# Patient Record
Sex: Female | Born: 1975
Health system: Southern US, Community
[De-identification: ages and names within clinical notes are randomized; demographics above are authoritative.]

## PROBLEM LIST (undated history)

## (undated) DIAGNOSIS — G43909 Migraine, unspecified, not intractable, without status migrainosus: Secondary | ICD-10-CM

## (undated) DIAGNOSIS — F4310 Post-traumatic stress disorder, unspecified: Secondary | ICD-10-CM

## (undated) DIAGNOSIS — G8929 Other chronic pain: Secondary | ICD-10-CM

## (undated) DIAGNOSIS — I10 Essential (primary) hypertension: Secondary | ICD-10-CM

## (undated) DIAGNOSIS — IMO0002 Reserved for concepts with insufficient information to code with codable children: Secondary | ICD-10-CM

## (undated) DIAGNOSIS — F603 Borderline personality disorder: Secondary | ICD-10-CM

## (undated) DIAGNOSIS — J45909 Unspecified asthma, uncomplicated: Secondary | ICD-10-CM

## (undated) DIAGNOSIS — M542 Cervicalgia: Secondary | ICD-10-CM

## (undated) DIAGNOSIS — M419 Scoliosis, unspecified: Secondary | ICD-10-CM

## (undated) DIAGNOSIS — M797 Fibromyalgia: Secondary | ICD-10-CM

## (undated) DIAGNOSIS — K589 Irritable bowel syndrome without diarrhea: Secondary | ICD-10-CM

## (undated) DIAGNOSIS — F419 Anxiety disorder, unspecified: Secondary | ICD-10-CM

## (undated) DIAGNOSIS — G4733 Obstructive sleep apnea (adult) (pediatric): Secondary | ICD-10-CM

## (undated) DIAGNOSIS — F319 Bipolar disorder, unspecified: Secondary | ICD-10-CM

## (undated) DIAGNOSIS — M5430 Sciatica, unspecified side: Secondary | ICD-10-CM

## (undated) DIAGNOSIS — M25569 Pain in unspecified knee: Secondary | ICD-10-CM

## (undated) DIAGNOSIS — M1990 Unspecified osteoarthritis, unspecified site: Secondary | ICD-10-CM

## (undated) DIAGNOSIS — F410 Panic disorder [episodic paroxysmal anxiety] without agoraphobia: Secondary | ICD-10-CM

## (undated) DIAGNOSIS — D649 Anemia, unspecified: Secondary | ICD-10-CM

## (undated) DIAGNOSIS — N83519 Torsion of ovary and ovarian pedicle, unspecified side: Secondary | ICD-10-CM

## (undated) DIAGNOSIS — M329 Systemic lupus erythematosus, unspecified: Secondary | ICD-10-CM

## (undated) DIAGNOSIS — M549 Dorsalgia, unspecified: Secondary | ICD-10-CM

## (undated) DIAGNOSIS — K449 Diaphragmatic hernia without obstruction or gangrene: Secondary | ICD-10-CM

## (undated) HISTORY — PX: KNEE SURGERY: SHX244

## (undated) HISTORY — PX: CHOLECYSTECTOMY: SHX55

## (undated) HISTORY — PX: HERNIA REPAIR: SHX51

## (undated) HISTORY — PX: BREAST SURGERY: SHX581

---

## 2007-11-10 ENCOUNTER — Emergency Department (HOSPITAL_BASED_OUTPATIENT_CLINIC_OR_DEPARTMENT_OTHER): Admission: EM | Admit: 2007-11-10 | Discharge: 2007-11-11 | Payer: Self-pay | Admitting: Emergency Medicine

## 2008-04-28 ENCOUNTER — Ambulatory Visit: Payer: Self-pay | Admitting: Interventional Radiology

## 2008-04-28 ENCOUNTER — Ambulatory Visit (HOSPITAL_BASED_OUTPATIENT_CLINIC_OR_DEPARTMENT_OTHER): Admission: RE | Admit: 2008-04-28 | Discharge: 2008-04-28 | Payer: Self-pay | Admitting: Internal Medicine

## 2008-05-09 ENCOUNTER — Emergency Department (HOSPITAL_BASED_OUTPATIENT_CLINIC_OR_DEPARTMENT_OTHER): Admission: EM | Admit: 2008-05-09 | Discharge: 2008-05-09 | Payer: Self-pay | Admitting: Emergency Medicine

## 2008-05-09 ENCOUNTER — Ambulatory Visit: Payer: Self-pay | Admitting: Diagnostic Radiology

## 2008-05-23 ENCOUNTER — Encounter: Payer: Self-pay | Admitting: Internal Medicine

## 2008-06-14 ENCOUNTER — Encounter: Payer: Self-pay | Admitting: Internal Medicine

## 2008-06-14 ENCOUNTER — Ambulatory Visit (HOSPITAL_BASED_OUTPATIENT_CLINIC_OR_DEPARTMENT_OTHER): Admission: RE | Admit: 2008-06-14 | Discharge: 2008-06-14 | Payer: Self-pay | Admitting: Internal Medicine

## 2008-06-14 ENCOUNTER — Ambulatory Visit: Payer: Self-pay | Admitting: Diagnostic Radiology

## 2008-10-18 ENCOUNTER — Encounter: Payer: Self-pay | Admitting: Internal Medicine

## 2008-10-26 ENCOUNTER — Encounter: Payer: Self-pay | Admitting: Internal Medicine

## 2008-11-02 ENCOUNTER — Encounter: Payer: Self-pay | Admitting: Internal Medicine

## 2008-11-02 ENCOUNTER — Ambulatory Visit (HOSPITAL_BASED_OUTPATIENT_CLINIC_OR_DEPARTMENT_OTHER): Admission: RE | Admit: 2008-11-02 | Discharge: 2008-11-02 | Payer: Self-pay | Admitting: Internal Medicine

## 2008-11-02 ENCOUNTER — Ambulatory Visit: Payer: Self-pay | Admitting: Radiology

## 2008-11-09 DIAGNOSIS — F319 Bipolar disorder, unspecified: Secondary | ICD-10-CM

## 2008-11-09 DIAGNOSIS — J45909 Unspecified asthma, uncomplicated: Secondary | ICD-10-CM | POA: Insufficient documentation

## 2008-11-09 DIAGNOSIS — K219 Gastro-esophageal reflux disease without esophagitis: Secondary | ICD-10-CM | POA: Insufficient documentation

## 2008-11-10 ENCOUNTER — Ambulatory Visit: Payer: Self-pay | Admitting: Internal Medicine

## 2008-11-10 DIAGNOSIS — R05 Cough: Secondary | ICD-10-CM

## 2008-11-10 DIAGNOSIS — N83209 Unspecified ovarian cyst, unspecified side: Secondary | ICD-10-CM

## 2009-07-19 ENCOUNTER — Ambulatory Visit (HOSPITAL_BASED_OUTPATIENT_CLINIC_OR_DEPARTMENT_OTHER): Admission: RE | Admit: 2009-07-19 | Discharge: 2009-07-19 | Payer: Self-pay | Admitting: Internal Medicine

## 2009-07-19 ENCOUNTER — Ambulatory Visit: Payer: Self-pay | Admitting: Diagnostic Radiology

## 2009-11-08 ENCOUNTER — Emergency Department (HOSPITAL_BASED_OUTPATIENT_CLINIC_OR_DEPARTMENT_OTHER): Admission: EM | Admit: 2009-11-08 | Discharge: 2009-11-08 | Payer: Self-pay | Admitting: Emergency Medicine

## 2009-11-08 ENCOUNTER — Ambulatory Visit: Payer: Self-pay | Admitting: Diagnostic Radiology

## 2010-05-11 ENCOUNTER — Emergency Department (INDEPENDENT_AMBULATORY_CARE_PROVIDER_SITE_OTHER): Payer: Medicaid Other

## 2010-05-11 ENCOUNTER — Emergency Department (HOSPITAL_BASED_OUTPATIENT_CLINIC_OR_DEPARTMENT_OTHER)
Admission: EM | Admit: 2010-05-11 | Discharge: 2010-05-11 | Disposition: A | Discharge status: Home / Self Care | Payer: Medicaid Other | Attending: Emergency Medicine | Admitting: Emergency Medicine

## 2010-05-11 DIAGNOSIS — J40 Bronchitis, not specified as acute or chronic: Secondary | ICD-10-CM | POA: Insufficient documentation

## 2010-05-11 DIAGNOSIS — J3489 Other specified disorders of nose and nasal sinuses: Secondary | ICD-10-CM | POA: Insufficient documentation

## 2010-05-11 DIAGNOSIS — F411 Generalized anxiety disorder: Secondary | ICD-10-CM | POA: Insufficient documentation

## 2010-05-11 DIAGNOSIS — R05 Cough: Secondary | ICD-10-CM

## 2010-05-11 DIAGNOSIS — R509 Fever, unspecified: Secondary | ICD-10-CM | POA: Insufficient documentation

## 2010-05-11 DIAGNOSIS — K219 Gastro-esophageal reflux disease without esophagitis: Secondary | ICD-10-CM | POA: Insufficient documentation

## 2010-05-11 DIAGNOSIS — R0602 Shortness of breath: Secondary | ICD-10-CM | POA: Insufficient documentation

## 2010-05-11 DIAGNOSIS — F319 Bipolar disorder, unspecified: Secondary | ICD-10-CM | POA: Insufficient documentation

## 2010-05-11 DIAGNOSIS — R059 Cough, unspecified: Secondary | ICD-10-CM | POA: Insufficient documentation

## 2010-05-11 DIAGNOSIS — R079 Chest pain, unspecified: Secondary | ICD-10-CM | POA: Insufficient documentation

## 2010-05-11 DIAGNOSIS — Z79899 Other long term (current) drug therapy: Secondary | ICD-10-CM | POA: Insufficient documentation

## 2010-05-11 DIAGNOSIS — J45909 Unspecified asthma, uncomplicated: Secondary | ICD-10-CM | POA: Insufficient documentation

## 2010-05-11 DIAGNOSIS — M329 Systemic lupus erythematosus, unspecified: Secondary | ICD-10-CM | POA: Insufficient documentation

## 2010-06-01 LAB — CBC
HCT: 40.3 % (ref 36.0–46.0)
Hemoglobin: 13.5 g/dL (ref 12.0–15.0)
MCH: 27.4 pg (ref 26.0–34.0)
MCV: 81.7 fL (ref 78.0–100.0)
Platelets: 322 10*3/uL (ref 150–400)

## 2010-06-01 LAB — D-DIMER, QUANTITATIVE: D-Dimer, Quant: 0.94 ug/mL-FEU — ABNORMAL HIGH (ref 0.00–0.48)

## 2010-06-01 LAB — DIFFERENTIAL
Basophils Absolute: 0.1 10*3/uL (ref 0.0–0.1)
Eosinophils Absolute: 0 10*3/uL (ref 0.0–0.7)
Eosinophils Relative: 0 % (ref 0–5)
Lymphs Abs: 0.9 10*3/uL (ref 0.7–4.0)
Monocytes Absolute: 0.2 10*3/uL (ref 0.1–1.0)
Monocytes Relative: 2 % — ABNORMAL LOW (ref 3–12)
Neutro Abs: 11.6 10*3/uL — ABNORMAL HIGH (ref 1.7–7.7)
Neutrophils Relative %: 91 % — ABNORMAL HIGH (ref 43–77)

## 2010-06-01 LAB — URINALYSIS, ROUTINE W REFLEX MICROSCOPIC
Bilirubin Urine: NEGATIVE
Protein, ur: NEGATIVE mg/dL
Specific Gravity, Urine: 1.01 (ref 1.005–1.030)
Urobilinogen, UA: 0.2 mg/dL (ref 0.0–1.0)
pH: 6.5 (ref 5.0–8.0)

## 2010-06-01 LAB — BASIC METABOLIC PANEL
BUN: 13 mg/dL (ref 6–23)
CO2: 26 mEq/L (ref 19–32)
GFR calc Af Amer: >60 mL/min (ref >60)
Sodium: 141 mEq/L (ref 135–145)

## 2010-06-01 LAB — URINE MICROSCOPIC-ADD ON

## 2010-06-01 LAB — PREGNANCY, URINE: Preg Test, Ur: NEGATIVE

## 2010-07-03 LAB — PREGNANCY, URINE: Preg Test, Ur: NEGATIVE

## 2010-07-03 LAB — URINALYSIS, ROUTINE W REFLEX MICROSCOPIC
Hgb urine dipstick: NEGATIVE
Ketones, ur: NEGATIVE mg/dL
Nitrite: NEGATIVE

## 2010-07-03 LAB — URINE MICROSCOPIC-ADD ON

## 2010-08-29 ENCOUNTER — Emergency Department (HOSPITAL_BASED_OUTPATIENT_CLINIC_OR_DEPARTMENT_OTHER)
Admission: EM | Admit: 2010-08-29 | Discharge: 2010-08-29 | Disposition: A | Discharge status: Home / Self Care | Payer: Medicaid Other | Attending: Emergency Medicine | Admitting: Emergency Medicine

## 2010-08-29 DIAGNOSIS — IMO0001 Reserved for inherently not codable concepts without codable children: Secondary | ICD-10-CM | POA: Insufficient documentation

## 2010-08-29 DIAGNOSIS — R112 Nausea with vomiting, unspecified: Secondary | ICD-10-CM | POA: Insufficient documentation

## 2010-08-29 DIAGNOSIS — F319 Bipolar disorder, unspecified: Secondary | ICD-10-CM | POA: Insufficient documentation

## 2010-08-29 DIAGNOSIS — J45909 Unspecified asthma, uncomplicated: Secondary | ICD-10-CM | POA: Insufficient documentation

## 2010-08-29 DIAGNOSIS — R51 Headache: Secondary | ICD-10-CM | POA: Insufficient documentation

## 2010-08-29 DIAGNOSIS — R42 Dizziness and giddiness: Secondary | ICD-10-CM | POA: Insufficient documentation

## 2010-08-29 LAB — CBC
HCT: 37.6 % (ref 36.0–46.0)
Hemoglobin: 12.7 g/dL (ref 12.0–15.0)
MCH: 26.6 pg (ref 26.0–34.0)
MCHC: 33.8 g/dL (ref 30.0–36.0)
RBC: 4.78 MIL/uL (ref 3.87–5.11)
WBC: 10.6 10*3/uL — ABNORMAL HIGH (ref 4.0–10.5)

## 2010-08-29 LAB — URINALYSIS, ROUTINE W REFLEX MICROSCOPIC
Hgb urine dipstick: NEGATIVE
Ketones, ur: NEGATIVE mg/dL
Protein, ur: NEGATIVE mg/dL
Specific Gravity, Urine: 1.015 (ref 1.005–1.030)
Urobilinogen, UA: 0.2 mg/dL (ref 0.0–1.0)

## 2010-08-29 LAB — COMPREHENSIVE METABOLIC PANEL
ALT: 20 U/L (ref 0–35)
Alkaline Phosphatase: 64 U/L (ref 39–117)
CO2: 24 mEq/L (ref 19–32)
Chloride: 101 mEq/L (ref 96–112)
Creatinine, Ser: 0.7 mg/dL (ref 0.4–1.2)
GFR calc Af Amer: >60 mL/min (ref >60)
GFR calc non Af Amer: >60 mL/min (ref >60)
Glucose, Bld: 90 mg/dL (ref 70–99)
Potassium: 3.6 mEq/L (ref 3.5–5.1)
Sodium: 138 mEq/L (ref 135–145)
Total Bilirubin: 0.3 mg/dL (ref 0.3–1.2)

## 2010-08-29 LAB — DIFFERENTIAL
Basophils Relative: 0 % (ref 0–1)
Eosinophils Absolute: 0.1 10*3/uL (ref 0.0–0.7)
Monocytes Absolute: 0.7 10*3/uL (ref 0.1–1.0)
Monocytes Relative: 6 % (ref 3–12)
Neutro Abs: 7.1 10*3/uL (ref 1.7–7.7)

## 2010-08-29 LAB — URINE MICROSCOPIC-ADD ON

## 2011-04-22 ENCOUNTER — Encounter (HOSPITAL_BASED_OUTPATIENT_CLINIC_OR_DEPARTMENT_OTHER): Payer: Self-pay | Admitting: *Deleted

## 2011-04-22 ENCOUNTER — Emergency Department (HOSPITAL_BASED_OUTPATIENT_CLINIC_OR_DEPARTMENT_OTHER)
Admission: EM | Admit: 2011-04-22 | Discharge: 2011-04-22 | Disposition: A | Discharge status: Home / Self Care | Payer: Medicaid Other | Attending: Emergency Medicine | Admitting: Emergency Medicine

## 2011-04-22 ENCOUNTER — Emergency Department (INDEPENDENT_AMBULATORY_CARE_PROVIDER_SITE_OTHER): Payer: Medicaid Other

## 2011-04-22 ENCOUNTER — Other Ambulatory Visit: Payer: Self-pay

## 2011-04-22 DIAGNOSIS — R1013 Epigastric pain: Secondary | ICD-10-CM

## 2011-04-22 DIAGNOSIS — K921 Melena: Secondary | ICD-10-CM

## 2011-04-22 DIAGNOSIS — R109 Unspecified abdominal pain: Secondary | ICD-10-CM | POA: Insufficient documentation

## 2011-04-22 DIAGNOSIS — F319 Bipolar disorder, unspecified: Secondary | ICD-10-CM | POA: Insufficient documentation

## 2011-04-22 DIAGNOSIS — R197 Diarrhea, unspecified: Secondary | ICD-10-CM | POA: Insufficient documentation

## 2011-04-22 DIAGNOSIS — R110 Nausea: Secondary | ICD-10-CM

## 2011-04-22 HISTORY — DX: Panic disorder (episodic paroxysmal anxiety): F41.0

## 2011-04-22 HISTORY — DX: Diaphragmatic hernia without obstruction or gangrene: K44.9

## 2011-04-22 HISTORY — DX: Bipolar disorder, unspecified: F31.9

## 2011-04-22 HISTORY — DX: Anxiety disorder, unspecified: F41.9

## 2011-04-22 LAB — DIFFERENTIAL
Eosinophils Absolute: 0 10*3/uL (ref 0.0–0.7)
Eosinophils Relative: 0 % (ref 0–5)
Lymphs Abs: 2.4 10*3/uL (ref 0.7–4.0)
Monocytes Relative: 6 % (ref 3–12)
Neutrophils Relative %: 69 % (ref 43–77)

## 2011-04-22 LAB — URINALYSIS, ROUTINE W REFLEX MICROSCOPIC
Bilirubin Urine: NEGATIVE
Hgb urine dipstick: NEGATIVE
Ketones, ur: NEGATIVE mg/dL
Protein, ur: NEGATIVE mg/dL
Urobilinogen, UA: 0.2 mg/dL (ref 0.0–1.0)

## 2011-04-22 LAB — CBC
HCT: 40.6 % (ref 36.0–46.0)
Hemoglobin: 13.6 g/dL (ref 12.0–15.0)
MCH: 26.6 pg (ref 26.0–34.0)
MCV: 79.5 fL (ref 78.0–100.0)
RBC: 5.11 MIL/uL (ref 3.87–5.11)

## 2011-04-22 LAB — COMPREHENSIVE METABOLIC PANEL
Alkaline Phosphatase: 64 U/L (ref 39–117)
BUN: 10 mg/dL (ref 6–23)
GFR calc Af Amer: >90 mL/min (ref >90)
Glucose, Bld: 90 mg/dL (ref 70–99)
Potassium: 3.6 mEq/L (ref 3.5–5.1)
Total Protein: 7.8 g/dL (ref 6.0–8.3)

## 2011-04-22 LAB — LIPASE, BLOOD: Lipase: 28 U/L (ref 11–59)

## 2011-04-22 LAB — URINE MICROSCOPIC-ADD ON

## 2011-04-22 LAB — OCCULT BLOOD X 1 CARD TO LAB, STOOL: Fecal Occult Bld: NEGATIVE

## 2011-04-22 MED ORDER — IOHEXOL 300 MG/ML  SOLN
20.0000 mL | INTRAMUSCULAR | Status: DC | PRN
  Administered 2011-04-22: 20 mL via ORAL

## 2011-04-22 MED ORDER — HYDROCODONE-ACETAMINOPHEN 5-325 MG PO TABS: 2.0000 | ORAL_TABLET | ORAL | Status: AC | PRN

## 2011-04-22 MED ORDER — SODIUM CHLORIDE 0.9 % IV SOLN
999.0000 mL | Freq: Once | INTRAVENOUS | Status: AC
  Administered 2011-04-22: 1000 mL via INTRAVENOUS

## 2011-04-22 MED ORDER — IOHEXOL 300 MG/ML  SOLN
100.0000 mL | Freq: Once | INTRAMUSCULAR | Status: AC | PRN
  Administered 2011-04-22: 100 mL via INTRAVENOUS

## 2011-04-22 MED ORDER — HYDROMORPHONE HCL PF 1 MG/ML IJ SOLN
1.0000 mg | Freq: Once | INTRAMUSCULAR | Status: AC
  Administered 2011-04-22: 1 mg via INTRAVENOUS
  Filled 2011-04-22: qty 1

## 2011-04-22 MED ORDER — OMEPRAZOLE 20 MG PO CPDR: 20.0000 mg | DELAYED_RELEASE_CAPSULE | Freq: Every day | ORAL | Status: DC

## 2011-04-22 MED ORDER — ONDANSETRON HCL 4 MG/2ML IJ SOLN
4.0000 mg | Freq: Once | INTRAMUSCULAR | Status: AC
  Administered 2011-04-22: 4 mg via INTRAVENOUS
  Filled 2011-04-22: qty 2

## 2011-04-22 NOTE — ED Provider Notes (Signed)
Medical screening examination/treatment/procedure(s) were performed by non-physician practitioner and as supervising physician I was immediately available for consultation/collaboration. Janeene Sand Y.   Gavin Pound. Ranessa Kosta, MD 04/22/11 2356

## 2011-04-22 NOTE — ED Notes (Signed)
Epigastric pain for a few days. Right arm numbness. Has been off medications x 2 weeks. Rx for Xanax ran out and she cannot get anyone to refill it. Blood in her stools today x 4.

## 2011-04-22 NOTE — ED Provider Notes (Signed)
History     CSN: [id number]  Arrival date & time 04/22/11  1500   First MD Initiated Contact with Patient 04/22/11 1659      Chief Complaint  Patient presents with  . Abdominal Pain    (Consider location/radiation/quality/duration/timing/severity/associated sxs/prior treatment) Patient is a 36 y.o. female presenting with abdominal pain. The history is provided by the patient. No language interpreter was used.  Abdominal Pain The primary symptoms of the illness include abdominal pain. The current episode started more than 2 days ago. The onset of the illness was gradual. The problem has been gradually worsening.  Associated with: hx of hiatal hernia. The patient states that she believes she is currently not pregnant. Risk factors for an acute abdominal problem include a history of abdominal surgery. Additional symptoms associated with the illness include anorexia. Symptoms associated with the illness do not include chills, constipation, frequency or back pain. Significant associated medical issues include GERD and gallstones. Significant associated medical issues do not include diverticulitis.  Pt has had gallbladder removed.  Pt had a colonoscopy and had a polyp removed.  Pt reports she has been told that she has a hiatal hernia.   Pt reports she has had diarrhea x 4 today with blood in stool  Past Medical History  Diagnosis Date  . Hiatal hernia   . Anxiety   . Bipolar 1 disorder   . Panic attack     Past Surgical History  Procedure Date  . Cholecystectomy   . Breast surgery     No family history on file.  History  Substance Use Topics  . Smoking status: Former Games developer  . Smokeless tobacco: Not on file  . Alcohol Use: No    OB History    Grav Para Term Preterm Abortions TAB SAB Ect Mult Living                  Review of Systems  Constitutional: Negative for chills.  Gastrointestinal: Positive for abdominal pain, blood in stool and anorexia. Negative for  constipation.  Genitourinary: Negative for frequency.  Musculoskeletal: Negative for back pain.  All other systems reviewed and are negative.    Allergies  Clindamycin/lincomycin and Latex  Home Medications   Current Outpatient Rx  Name Route Sig Dispense Refill  . NAPROXEN SODIUM 220 MG PO TABS Oral Take 220 mg by mouth 2 (two) times daily with a meal.      BP 123/88  Pulse 72  Temp(Src) 99.3 F (37.4 C) (Oral)  Resp 20  SpO2 100%  LMP 04/16/2011  Physical Exam  Nursing note and vitals reviewed. Constitutional: She appears well-developed and well-nourished.  HENT:  Head: Normocephalic and atraumatic.  Right Ear: External ear normal.  Left Ear: External ear normal.  Nose: Nose normal.  Mouth/Throat: Oropharynx is clear and moist.  Eyes: Conjunctivae and EOM are normal. Pupils are equal, round, and reactive to light.  Neck: Normal range of motion. Neck supple.  Cardiovascular: Normal rate.   Pulmonary/Chest: Effort normal.  Abdominal: Soft.  Genitourinary: Guaiac negative stool.  Musculoskeletal: Normal range of motion.  Neurological: She is alert.  Skin: Skin is warm.    ED Course  Procedures (including critical care time)  Labs Reviewed  URINALYSIS, ROUTINE W REFLEX MICROSCOPIC - Abnormal; Notable for the following:    Leukocytes, UA TRACE (*)    All other components within normal limits  URINE MICROSCOPIC-ADD ON - Abnormal; Notable for the following:    Bacteria, UA FEW (*)  All other components within normal limits  CBC  DIFFERENTIAL  COMPREHENSIVE METABOLIC PANEL  LIPASE, BLOOD  PREGNANCY, URINE  OCCULT BLOOD X 1 CARD TO LAB, STOOL   Ct Abdomen Pelvis W Contrast  04/22/2011  *RADIOLOGY REPORT*  Clinical Data: Epigastric pain with nausea and bloody stools for 4 days.  CT ABDOMEN AND PELVIS WITH CONTRAST  Technique:  Multidetector CT imaging of the abdomen and pelvis was performed following the standard protocol during bolus administration of  intravenous contrast.  Contrast: OMNIPAQUE IOHEXOL 300 MG/ML IV SOLN  Comparison: Chest CT 11/08/2009.  Findings: The lung bases are clear.  There is no pleural effusion. The gallbladder is surgically absent.  There is no significant biliary dilatation.  The liver, spleen, pancreas, adrenal glands and kidneys appear normal.  The bowel gas pattern is normal.  The appendix is not visualized. No inflammatory changes are identified.  There are no enlarged lymph nodes.  The uterus and ovaries appear normal.  The bladder appears normal. There are no acute or significant osseous findings.  There is a probable incidental bone island within the left aspect of the L5 vertebral body.  IMPRESSION: No acute abdominal pelvic findings.  Query prior appendectomy.  Original Report Authenticated By: Gerrianne Scale, M.D.     No diagnosis found.   Date: 04/22/2011  Rate: 68  Rhythm: normal sinus rhythm  QRS Axis: normal  Intervals: normal  ST/T Wave abnormalities: normal  Conduction Disutrbances:none  Narrative Interpretation:   Old EKG Reviewed: unchanged   MDM   I advised pt to take prilosec.  I advised recheck with her Gi doctor.  Pt given hydrocodone 10 tablets for pain.         Langston Masker, [place] 04/22/11 2155

## 2011-11-26 ENCOUNTER — Other Ambulatory Visit (HOSPITAL_COMMUNITY)
Admission: RE | Admit: 2011-11-26 | Discharge: 2011-11-26 | Disposition: A | Discharge status: Home / Self Care | Payer: Medicaid Other | Source: Ambulatory Visit | Attending: Obstetrics & Gynecology | Admitting: Obstetrics & Gynecology

## 2011-11-26 DIAGNOSIS — Z113 Encounter for screening for infections with a predominantly sexual mode of transmission: Secondary | ICD-10-CM | POA: Insufficient documentation

## 2011-11-26 DIAGNOSIS — Z124 Encounter for screening for malignant neoplasm of cervix: Secondary | ICD-10-CM | POA: Insufficient documentation

## 2011-11-26 DIAGNOSIS — Z1151 Encounter for screening for human papillomavirus (HPV): Secondary | ICD-10-CM | POA: Insufficient documentation

## 2012-03-21 ENCOUNTER — Emergency Department (HOSPITAL_BASED_OUTPATIENT_CLINIC_OR_DEPARTMENT_OTHER)
Admission: EM | Admit: 2012-03-21 | Discharge: 2012-03-21 | Disposition: A | Discharge status: Home / Self Care | Payer: Medicaid Other | Attending: Emergency Medicine | Admitting: Emergency Medicine

## 2012-03-21 ENCOUNTER — Encounter (HOSPITAL_BASED_OUTPATIENT_CLINIC_OR_DEPARTMENT_OTHER): Payer: Self-pay | Admitting: *Deleted

## 2012-03-21 ENCOUNTER — Emergency Department (HOSPITAL_BASED_OUTPATIENT_CLINIC_OR_DEPARTMENT_OTHER): Payer: Medicaid Other

## 2012-03-21 DIAGNOSIS — R509 Fever, unspecified: Secondary | ICD-10-CM | POA: Insufficient documentation

## 2012-03-21 DIAGNOSIS — R0789 Other chest pain: Secondary | ICD-10-CM | POA: Insufficient documentation

## 2012-03-21 DIAGNOSIS — J069 Acute upper respiratory infection, unspecified: Secondary | ICD-10-CM | POA: Insufficient documentation

## 2012-03-21 DIAGNOSIS — R5381 Other malaise: Secondary | ICD-10-CM | POA: Insufficient documentation

## 2012-03-21 DIAGNOSIS — Z8659 Personal history of other mental and behavioral disorders: Secondary | ICD-10-CM | POA: Insufficient documentation

## 2012-03-21 DIAGNOSIS — R110 Nausea: Secondary | ICD-10-CM | POA: Insufficient documentation

## 2012-03-21 DIAGNOSIS — Z79899 Other long term (current) drug therapy: Secondary | ICD-10-CM | POA: Insufficient documentation

## 2012-03-21 DIAGNOSIS — Z8719 Personal history of other diseases of the digestive system: Secondary | ICD-10-CM | POA: Insufficient documentation

## 2012-03-21 DIAGNOSIS — Z87891 Personal history of nicotine dependence: Secondary | ICD-10-CM | POA: Insufficient documentation

## 2012-03-21 DIAGNOSIS — F319 Bipolar disorder, unspecified: Secondary | ICD-10-CM | POA: Insufficient documentation

## 2012-03-21 DIAGNOSIS — IMO0001 Reserved for inherently not codable concepts without codable children: Secondary | ICD-10-CM | POA: Insufficient documentation

## 2012-03-21 DIAGNOSIS — R6889 Other general symptoms and signs: Secondary | ICD-10-CM

## 2012-03-21 DIAGNOSIS — R5383 Other fatigue: Secondary | ICD-10-CM | POA: Insufficient documentation

## 2012-03-21 DIAGNOSIS — M2550 Pain in unspecified joint: Secondary | ICD-10-CM | POA: Insufficient documentation

## 2012-03-21 DIAGNOSIS — R52 Pain, unspecified: Secondary | ICD-10-CM | POA: Insufficient documentation

## 2012-03-21 DIAGNOSIS — J029 Acute pharyngitis, unspecified: Secondary | ICD-10-CM | POA: Insufficient documentation

## 2012-03-21 DIAGNOSIS — R51 Headache: Secondary | ICD-10-CM | POA: Insufficient documentation

## 2012-03-21 MED ORDER — ACETAMINOPHEN 325 MG PO TABS: 650.0000 mg | ORAL_TABLET | Freq: Four times a day (QID) | ORAL | Status: DC | PRN

## 2012-03-21 MED ORDER — ACETAMINOPHEN 325 MG PO TABS
650.0000 mg | ORAL_TABLET | Freq: Once | ORAL | Status: AC
  Administered 2012-03-21: 650 mg via ORAL
  Filled 2012-03-21: qty 2

## 2012-03-21 MED ORDER — OSELTAMIVIR PHOSPHATE 75 MG PO CAPS
75.0000 mg | ORAL_CAPSULE | Freq: Two times a day (BID) | ORAL | Status: DC
Start: 2012-03-21 — End: 2013-04-03

## 2012-03-21 NOTE — ED Notes (Signed)
Pt d/c home with spouse- rx x 2 given for tamiflu and tylenol

## 2012-03-21 NOTE — ED Notes (Signed)
Pt c/o URI symptoms  x 1 day  

## 2012-03-21 NOTE — ED Provider Notes (Signed)
History     CSN: [id number]  Arrival date & time 03/21/12  1522   First MD Initiated Contact with Patient 03/21/12 1544      Chief Complaint  Patient presents with  . URI    (Consider location/radiation/quality/duration/timing/severity/associated sxs/prior treatment) HPI Comments: 37 year old female presents the emergency department complaining of fever and cough beginning last night. Cough is productive with green mucus. She took ibuprofen prior to arrival since she felt she had a fever. Admits to associated headache, body aches and nausea. Her husband is sick with similar symptoms. She did not have her flu shot this year.  The history is provided by the patient.    Past Medical History  Diagnosis Date  . Hiatal hernia   . Anxiety   . Bipolar 1 disorder   . Panic attack     Past Surgical History  Procedure Date  . Cholecystectomy   . Breast surgery     History reviewed. No pertinent family history.  History  Substance Use Topics  . Smoking status: Former Games developer  . Smokeless tobacco: Not on file  . Alcohol Use: No    OB History    Grav Para Term Preterm Abortions TAB SAB Ect Mult Living                  Review of Systems  Constitutional: Positive for fever, chills and fatigue.  HENT: Positive for sore throat. Negative for neck pain and neck stiffness.   Respiratory: Positive for cough and chest tightness. Negative for wheezing.   Cardiovascular: Negative for chest pain.  Gastrointestinal: Positive for nausea. Negative for vomiting.  Genitourinary: Negative.   Musculoskeletal: Positive for myalgias and arthralgias.  Skin: Negative.   Neurological: Positive for headaches.  Psychiatric/Behavioral: Negative for confusion.    Allergies  Clindamycin/lincomycin and Latex  Home Medications   Current Outpatient Rx  Name  Route  Sig  Dispense  Refill  . NAPROXEN SODIUM 220 MG PO TABS   Oral   Take 220 mg by mouth 2 (two) times daily with a meal.           . OMEPRAZOLE 20 MG PO CPDR   Oral   Take 1 capsule (20 mg total) by mouth daily.   30 capsule   0     BP 121/81  Pulse 120  Temp 102.7 F (39.3 C) (Oral)  Resp 16  Ht 5\' 9"  (1.753 m)  Wt 200 lb (90.719 kg)  BMI 29.53 kg/m2  SpO2 100%  LMP 03/07/2012  Physical Exam  Constitutional: She is oriented to person, place, and time. She appears well-developed and well-nourished. No distress. Face mask in place.       Coughing, appears sick.  HENT:  Head: Normocephalic and atraumatic.  Nose: Mucosal edema present.  Mouth/Throat: Posterior oropharyngeal erythema present. No oropharyngeal exudate or posterior oropharyngeal edema.  Eyes: Conjunctivae normal are normal.  Neck: Normal range of motion. Neck supple.  Cardiovascular: Regular rhythm and normal heart sounds.  Tachycardia present.   Pulmonary/Chest: Effort normal. She has rhonchi (scattered expiratory, cleared with coughing).  Abdominal: Soft. Normal appearance and bowel sounds are normal. There is generalized tenderness (mild).  Musculoskeletal: Normal range of motion. She exhibits no edema.  Lymphadenopathy:    She has cervical adenopathy.  Neurological: She is alert and oriented to person, place, and time.  Skin: Skin is warm.  Psychiatric: She has a normal mood and affect. Her behavior is normal.    ED Course  Procedures (  including critical care time)  Labs Reviewed - No data to display Dg Chest 2 View  03/21/2012  *RADIOLOGY REPORT*  Clinical Data: Fever, cough, shortness of breath  CHEST - 2 VIEW  Comparison: 05/11/2010  Findings: Cardiomediastinal silhouette is within normal limits. The lungs are clear. No pleural effusion.  No pneumothorax.  No acute osseous abnormality.  A clip projects over the soft tissues of the left breast. Cholecystectomy clips noted.  IMPRESSION: No acute cardiopulmonary process.   Original Report Authenticated By: Christiana Pellant, M.D.      1. Flu-like symptoms   2. URI (upper respiratory  infection)       MDM  37 y/o female with flu-like symptoms. Ronchi on lung auscultation cleared with cough. CXR obtained to r/o pneumonia. CXR normal. Tylenol given for fever. Rx tamiflu. Infection care and precautions discussed. Return precautions discussed. Patient states understanding of plan and is agreeable.         Trevor Mace, PA-C 03/21/12 1651

## 2012-03-21 NOTE — ED Provider Notes (Signed)
Medical screening examination/treatment/procedure(s) were performed by non-physician practitioner and as supervising physician I was immediately available for consultation/collaboration.   Dione Booze, MD 03/21/12 [phone]

## 2012-07-10 ENCOUNTER — Emergency Department (HOSPITAL_BASED_OUTPATIENT_CLINIC_OR_DEPARTMENT_OTHER)
Admission: EM | Admit: 2012-07-10 | Discharge: 2012-07-10 | Disposition: A | Discharge status: Home / Self Care | Payer: Medicaid Other | Attending: Emergency Medicine | Admitting: Emergency Medicine

## 2012-07-10 ENCOUNTER — Encounter (HOSPITAL_BASED_OUTPATIENT_CLINIC_OR_DEPARTMENT_OTHER): Payer: Self-pay | Admitting: *Deleted

## 2012-07-10 ENCOUNTER — Emergency Department (HOSPITAL_BASED_OUTPATIENT_CLINIC_OR_DEPARTMENT_OTHER): Payer: Medicaid Other

## 2012-07-10 DIAGNOSIS — Z87440 Personal history of urinary (tract) infections: Secondary | ICD-10-CM | POA: Insufficient documentation

## 2012-07-10 DIAGNOSIS — R238 Other skin changes: Secondary | ICD-10-CM | POA: Insufficient documentation

## 2012-07-10 DIAGNOSIS — Z9089 Acquired absence of other organs: Secondary | ICD-10-CM | POA: Insufficient documentation

## 2012-07-10 DIAGNOSIS — Z87891 Personal history of nicotine dependence: Secondary | ICD-10-CM | POA: Insufficient documentation

## 2012-07-10 DIAGNOSIS — M549 Dorsalgia, unspecified: Secondary | ICD-10-CM

## 2012-07-10 DIAGNOSIS — Z8719 Personal history of other diseases of the digestive system: Secondary | ICD-10-CM | POA: Insufficient documentation

## 2012-07-10 DIAGNOSIS — F319 Bipolar disorder, unspecified: Secondary | ICD-10-CM | POA: Insufficient documentation

## 2012-07-10 DIAGNOSIS — I10 Essential (primary) hypertension: Secondary | ICD-10-CM | POA: Insufficient documentation

## 2012-07-10 DIAGNOSIS — M546 Pain in thoracic spine: Secondary | ICD-10-CM | POA: Insufficient documentation

## 2012-07-10 DIAGNOSIS — Z9889 Other specified postprocedural states: Secondary | ICD-10-CM | POA: Insufficient documentation

## 2012-07-10 DIAGNOSIS — Z91040 Latex allergy status: Secondary | ICD-10-CM | POA: Insufficient documentation

## 2012-07-10 DIAGNOSIS — Z3202 Encounter for pregnancy test, result negative: Secondary | ICD-10-CM | POA: Insufficient documentation

## 2012-07-10 DIAGNOSIS — F410 Panic disorder [episodic paroxysmal anxiety] without agoraphobia: Secondary | ICD-10-CM | POA: Insufficient documentation

## 2012-07-10 DIAGNOSIS — Z79899 Other long term (current) drug therapy: Secondary | ICD-10-CM | POA: Insufficient documentation

## 2012-07-10 HISTORY — DX: Essential (primary) hypertension: I10

## 2012-07-10 LAB — CBC WITH DIFFERENTIAL/PLATELET
Eosinophils Absolute: 0.2 10*3/uL (ref 0.0–0.7)
Eosinophils Relative: 2 % (ref 0–5)
Hemoglobin: 13.3 g/dL (ref 12.0–15.0)
Lymphocytes Relative: 25 % (ref 12–46)
Lymphs Abs: 2.6 10*3/uL (ref 0.7–4.0)
MCH: 26.9 pg (ref 26.0–34.0)
MCV: 80 fL (ref 78.0–100.0)
Monocytes Relative: 7 % (ref 3–12)
RBC: 4.94 MIL/uL (ref 3.87–5.11)

## 2012-07-10 LAB — URINALYSIS, ROUTINE W REFLEX MICROSCOPIC
Nitrite: NEGATIVE
Specific Gravity, Urine: 1.018 (ref 1.005–1.030)
pH: 6 (ref 5.0–8.0)

## 2012-07-10 LAB — URINE MICROSCOPIC-ADD ON

## 2012-07-10 LAB — BASIC METABOLIC PANEL
BUN: 11 mg/dL (ref 6–23)
CO2: 27 mEq/L (ref 19–32)
GFR calc non Af Amer: >90 mL/min (ref >90)
Glucose, Bld: 94 mg/dL (ref 70–99)
Potassium: 3.7 mEq/L (ref 3.5–5.1)

## 2012-07-10 LAB — PREGNANCY, URINE: Preg Test, Ur: NEGATIVE

## 2012-07-10 MED ORDER — HYDROCODONE-ACETAMINOPHEN 5-325 MG PO TABS: 1.0000 | ORAL_TABLET | ORAL | Status: DC | PRN

## 2012-07-10 MED ORDER — SODIUM CHLORIDE 0.9 % IV BOLUS (SEPSIS)
500.0000 mL | Freq: Once | INTRAVENOUS | Status: AC
  Administered 2012-07-10: 19:00:00 via INTRAVENOUS

## 2012-07-10 MED ORDER — KETOROLAC TROMETHAMINE 30 MG/ML IJ SOLN
30.0000 mg | Freq: Once | INTRAMUSCULAR | Status: AC
  Administered 2012-07-10: 30 mg via INTRAVENOUS
  Filled 2012-07-10: qty 1

## 2012-07-10 NOTE — ED Provider Notes (Signed)
History     CSN: [id number]  Arrival date & time 07/10/12  1650   First MD Initiated Contact with Patient 07/10/12 1822      Chief Complaint  Patient presents with  . Flank Pain    (Consider location/radiation/quality/duration/timing/severity/associated sxs/prior treatment) HPI This is a 37 year old female with left flank pain has been present since Easter Sunday. She states it was sharp radiated around the side and is in the left mid back. It is worse with movement. She is in school and had her urine checked at the school several days ago and states that she was told she had glucose and a urinary tract action and has received the treatment. She has not had frequency of urination, nausea, vomiting, fever, or chills. He states that she does have interstitial cystitis. She has taken Tylenol without relief. She is concerned she was recently placed on metoprolol heals this may be causing her symptoms. She states that she has had pain in this area with water touching it, movement of any kind, or massage. She states the pain is severe. She states that Vicodin is the only pain medicine that works for her and is okay with the drug screen sent at her school. Past Medical History  Diagnosis Date  . Hiatal hernia   . Anxiety   . Bipolar 1 disorder   . Panic attack   . Hypertension     Past Surgical History  Procedure Laterality Date  . Cholecystectomy    . Breast surgery    . Hernia repair      No family history on file.  History  Substance Use Topics  . Smoking status: Former Games developer  . Smokeless tobacco: Not on file  . Alcohol Use: No    OB History   Grav Para Term Preterm Abortions TAB SAB Ect Mult Living                  Review of Systems  Unable to perform ROS Constitutional: Negative.   HENT: Negative.   Eyes: Negative.   Respiratory: Negative.   Cardiovascular: Negative.   Gastrointestinal: Negative.   Endocrine: Negative.   Genitourinary: Negative.    Musculoskeletal:       Left-sided back pain.. No history of trauma the  Skin: Positive for color change.  Hematological: Negative.   All other systems reviewed and are negative.    Allergies  Clindamycin/lincomycin and Latex  Home Medications   Current Outpatient Rx  Name  Route  Sig  Dispense  Refill  . ALBUTEROL IN   Inhalation   Inhale into the lungs.         . ALPRAZolam (XANAX) 0.5 MG tablet   Oral   Take 0.5 mg by mouth at bedtime as needed for sleep.         . metoprolol tartrate (LOPRESSOR) 25 MG tablet   Oral   Take 25 mg by mouth 2 (two) times daily.         . Oxcarbazepine (TRILEPTAL) 300 MG tablet   Oral   Take 300 mg by mouth 2 (two) times daily.         . pregabalin (LYRICA) 75 MG capsule   Oral   Take 75 mg by mouth 2 (two) times daily.         . Vitamin D, Ergocalciferol, (DRISDOL) 50000 UNITS CAPS   Oral   Take 50,000 Units by mouth.         [name] acetaminophen (TYLENOL) 325 MG  tablet   Oral   Take 2 tablets (650 mg total) by mouth every 6 (six) hours as needed for pain.   30 tablet   0   . naproxen sodium (ANAPROX) 220 MG tablet   Oral   Take 220 mg by mouth 2 (two) times daily with a meal.         . EXPIRED: omeprazole (PRILOSEC) 20 MG capsule   Oral   Take 1 capsule (20 mg total) by mouth daily.   30 capsule   0   . oseltamivir (TAMIFLU) 75 MG capsule   Oral   Take 1 capsule (75 mg total) by mouth every 12 (twelve) hours.   10 capsule   0     BP 116/74  Pulse 102  Temp(Src) 98.7 F (37.1 C) (Oral)  Resp 22  Wt 200 lb (90.719 kg)  BMI 29.52 kg/m2  SpO2 97%  Physical Exam  Nursing note and vitals reviewed. Constitutional: She is oriented to person, place, and time. She appears well-developed and well-nourished.  HENT:  Head: Normocephalic and atraumatic.  Right Ear: External ear normal.  Left Ear: External ear normal.  Nose: Nose normal.  Mouth/Throat: Oropharynx is clear and moist.  Eyes: Conjunctivae and  EOM are normal. Pupils are equal, round, and reactive to light.  Neck: Normal range of motion. Neck supple.  Cardiovascular: Normal rate, regular rhythm, normal heart sounds and intact distal pulses.   Pulmonary/Chest: Effort normal and breath sounds normal.  Abdominal: Soft. Bowel sounds are normal.  Musculoskeletal: Normal range of motion.  Tenderness to palpation left paraspinal area throughout thoracic area.  Neurological: She is alert and oriented to person, place, and time. She has normal reflexes.  Skin: Skin is warm and dry.  Psychiatric: She has a normal mood and affect. Her behavior is normal. Thought content normal.    ED Course  Procedures (including critical care time)  Labs Reviewed  URINALYSIS, ROUTINE W REFLEX MICROSCOPIC - Abnormal; Notable for the following:    Leukocytes, UA SMALL (*)    All other components within normal limits  PREGNANCY, URINE  URINE MICROSCOPIC-ADD ON  CBC WITH DIFFERENTIAL  BASIC METABOLIC PANEL   Ct Abdomen Pelvis Wo Contrast  07/10/2012  *RADIOLOGY REPORT*  Clinical Data: Left-sided flank pain.  CT ABDOMEN AND PELVIS WITHOUT CONTRAST  Technique:  Multidetector CT imaging of the abdomen and pelvis was performed following the standard protocol without intravenous contrast.  Comparison: CT of the abdomen and pelvis 04/22/2011.  Findings:  Lung Bases: 6 mm subpleural nodule in the periphery of the left lower lobe (image 70 of series 4) is unchanged in retrospect compared to remote prior examination from 11/02/2008, and can be considered radiographically benign requiring no imaging follow-up (likely a subpleural lymph node).  Abdomen/Pelvis:  No abnormal calcifications are noted within the collecting system on either kidney, along the course of either ureter, or within the lumen of the urinary bladder.  No hydroureteronephrosis or perinephric stranding to suggest urinary tract obstruction at this time.  On these noncontrast images, there are no focal  renal lesions confidently identified.  Mild diffuse decreased attenuation throughout the hepatic parenchyma, suggestive of mild hepatic steatosis.  No definite focal hepatic lesions on this noncontrast CT examination.  Status post cholecystectomy.  The unenhanced appearance of the pancreas, spleen and bilateral adrenal glands is unremarkable.  No significant volume of ascites, no pneumoperitoneum and no pathologic distension of small bowel.  No definite pathologic lymphadenopathy identified within the abdomen or  pelvis on today's noncontrast CT examination.  Uterus and left ovary are unremarkable.  There is a 3.2 cm low-intermediate attenuation lesion in the right ovary, which likely represents a cyst.  Musculoskeletal: There are no aggressive appearing lytic or blastic lesions noted in the visualized portions of the skeleton.  A small sclerotic lesion in the left side of L5 likely represents a bone island (unchanged).  IMPRESSION: 1.  No acute findings in the abdomen or pelvis to account for the patient's symptoms.  Specifically, no abnormal urinary tract calculi. 2.  3.2 cm right adnexal lesion likely represents an ovarian cyst. 3.  Status post cholecystectomy.   Original Report Authenticated By: Trudie Reed, M.D.      No diagnosis found.    Results for orders placed during the hospital encounter of 07/10/12  URINALYSIS, ROUTINE W REFLEX MICROSCOPIC      Result Value Range   Color, Urine YELLOW  YELLOW   APPearance CLEAR  CLEAR   Specific Gravity, Urine 1.018  1.005 - 1.030   pH 6.0  5.0 - 8.0   Glucose, UA NEGATIVE  NEGATIVE mg/dL   Hgb urine dipstick NEGATIVE  NEGATIVE   Bilirubin Urine NEGATIVE  NEGATIVE   Ketones, ur NEGATIVE  NEGATIVE mg/dL   Protein, ur NEGATIVE  NEGATIVE mg/dL   Urobilinogen, UA 0.2  0.0 - 1.0 mg/dL   Nitrite NEGATIVE  NEGATIVE   Leukocytes, UA SMALL (*) NEGATIVE  PREGNANCY, URINE      Result Value Range   Preg Test, Ur NEGATIVE  NEGATIVE  URINE MICROSCOPIC-ADD  ON      Result Value Range   Squamous Epithelial / LPF RARE  RARE   WBC, UA 3-6  <3 WBC/hpf   Bacteria, UA RARE  RARE  CBC WITH DIFFERENTIAL      Result Value Range   WBC 10.3  4.0 - 10.5 K/uL   RBC 4.94  3.87 - 5.11 MIL/uL   Hemoglobin 13.3  12.0 - 15.0 g/dL   HCT 47.8  29.5 - 62.1 %   MCV 80.0  78.0 - 100.0 fL   MCH 26.9  26.0 - 34.0 pg   MCHC 33.7  30.0 - 36.0 g/dL   RDW 30.8  65.7 - 84.6 %   Platelets 279  150 - 400 K/uL   Neutrophils Relative 66  43 - 77 %   Neutro Abs 6.8  1.7 - 7.7 K/uL   Lymphocytes Relative 25  12 - 46 %   Lymphs Abs 2.6  0.7 - 4.0 K/uL   Monocytes Relative 7  3 - 12 %   Monocytes Absolute 0.7  0.1 - 1.0 K/uL   Eosinophils Relative 2  0 - 5 %   Eosinophils Absolute 0.2  0.0 - 0.7 K/uL   Basophils Relative 0  0 - 1 %   Basophils Absolute 0.0  0.0 - 0.1 K/uL  BASIC METABOLIC PANEL      Result Value Range   Sodium 138  135 - 145 mEq/L   Potassium 3.7  3.5 - 5.1 mEq/L   Chloride 102  96 - 112 mEq/L   CO2 27  19 - 32 mEq/L   Glucose, Bld 94  70 - 99 mg/dL   BUN 11  6 - 23 mg/dL   Creatinine, Ser 9.62  0.50 - 1.10 mg/dL   Calcium 9.4  8.4 - 95.2 mg/dL   GFR calc non Af Amer >90  >90 mL/min   GFR calc Af Amer >90  >  90 mL/min          Urine was clean catch and does not show any evidence of acute UTI with only a few squamous cells and white blood cells. CBC and be met are normal. CT of abdomen and pelvis did not show any acute abnormality. Patient had Toradol given. She'll have a prescription for Vicodin numbering 6 and advised have close followup with her primary care Dr. as she appears to have musculoskeletal pain but may require further work workup      Hilario Quarry, MD 07/10/12 2038

## 2012-07-10 NOTE — ED Notes (Signed)
Left flank pain for a few days.

## 2012-07-10 NOTE — ED Notes (Signed)
Patient transported to CT 

## 2012-09-22 ENCOUNTER — Institutional Professional Consult (permissible substitution): Payer: Medicaid Other | Admitting: Pulmonary Disease

## 2012-10-30 ENCOUNTER — Institutional Professional Consult (permissible substitution): Payer: Medicaid Other | Admitting: Internal Medicine

## 2013-04-03 ENCOUNTER — Emergency Department (HOSPITAL_BASED_OUTPATIENT_CLINIC_OR_DEPARTMENT_OTHER)
Admission: EM | Admit: 2013-04-03 | Discharge: 2013-04-03 | Disposition: A | Discharge status: Home / Self Care | Payer: Self-pay | Attending: Emergency Medicine | Admitting: Emergency Medicine

## 2013-04-03 ENCOUNTER — Emergency Department (HOSPITAL_BASED_OUTPATIENT_CLINIC_OR_DEPARTMENT_OTHER): Payer: Self-pay

## 2013-04-03 ENCOUNTER — Encounter (HOSPITAL_BASED_OUTPATIENT_CLINIC_OR_DEPARTMENT_OTHER): Payer: Self-pay | Admitting: Emergency Medicine

## 2013-04-03 DIAGNOSIS — Z79899 Other long term (current) drug therapy: Secondary | ICD-10-CM | POA: Insufficient documentation

## 2013-04-03 DIAGNOSIS — Z8739 Personal history of other diseases of the musculoskeletal system and connective tissue: Secondary | ICD-10-CM | POA: Insufficient documentation

## 2013-04-03 DIAGNOSIS — I10 Essential (primary) hypertension: Secondary | ICD-10-CM | POA: Insufficient documentation

## 2013-04-03 DIAGNOSIS — Z8719 Personal history of other diseases of the digestive system: Secondary | ICD-10-CM | POA: Insufficient documentation

## 2013-04-03 DIAGNOSIS — Z8659 Personal history of other mental and behavioral disorders: Secondary | ICD-10-CM | POA: Insufficient documentation

## 2013-04-03 DIAGNOSIS — Z91040 Latex allergy status: Secondary | ICD-10-CM | POA: Insufficient documentation

## 2013-04-03 DIAGNOSIS — J45909 Unspecified asthma, uncomplicated: Secondary | ICD-10-CM | POA: Insufficient documentation

## 2013-04-03 DIAGNOSIS — Z87891 Personal history of nicotine dependence: Secondary | ICD-10-CM | POA: Insufficient documentation

## 2013-04-03 DIAGNOSIS — J069 Acute upper respiratory infection, unspecified: Secondary | ICD-10-CM | POA: Insufficient documentation

## 2013-04-03 HISTORY — DX: Reserved for concepts with insufficient information to code with codable children: IMO0002

## 2013-04-03 HISTORY — DX: Systemic lupus erythematosus, unspecified: M32.9

## 2013-04-03 HISTORY — DX: Unspecified asthma, uncomplicated: J45.909

## 2013-04-03 MED ORDER — ALBUTEROL SULFATE HFA 108 (90 BASE) MCG/ACT IN AERS: 1.0000 | INHALATION_SPRAY | Freq: Four times a day (QID) | RESPIRATORY_TRACT | Status: AC | PRN

## 2013-04-03 MED ORDER — HYDROCOD POLST-CHLORPHEN POLST 10-8 MG/5ML PO LQCR: 5.0000 mL | Freq: Two times a day (BID) | ORAL | Status: DC | PRN

## 2013-04-03 MED ORDER — GI COCKTAIL ~~LOC~~
30.0000 mL | Freq: Once | ORAL | Status: AC
  Administered 2013-04-03: 30 mL via ORAL
  Filled 2013-04-03: qty 30

## 2013-04-03 MED ORDER — KETOROLAC TROMETHAMINE 30 MG/ML IJ SOLN
30.0000 mg | Freq: Once | INTRAMUSCULAR | Status: AC
  Administered 2013-04-03: 30 mg via INTRAMUSCULAR
  Filled 2013-04-03: qty 1

## 2013-04-03 NOTE — ED Provider Notes (Signed)
CSN: [id number]     Arrival date & time 04/03/13  1050 History   First MD Initiated Contact with Patient 04/03/13 1133     Chief Complaint  Patient presents with  . URI   (Consider location/radiation/quality/duration/timing/severity/associated sxs/prior Treatment) HPI Patient presents with cough, congestion, generalized discomfort. Symptoms began approximately 10 days ago.  Since onset symptoms have been persistent with transient relief from OTC medication. No vomiting, diarrhea, confusion, disorientation, falling. Patient has multiple medical problems, including lupus. Patient has multiple sick contacts, including both of her parents were being evaluated here for similar complaints.     Past Medical History  Diagnosis Date  . Hiatal hernia   . Anxiety   . Bipolar 1 disorder   . Panic attack   . Hypertension   . Asthma   . Lupus    Past Surgical History  Procedure Laterality Date  . Cholecystectomy    . Breast surgery    . Hernia repair     History reviewed. No pertinent family history. History  Substance Use Topics  . Smoking status: Former Games developer  . Smokeless tobacco: Not on file  . Alcohol Use: No   OB History   Grav Para Term Preterm Abortions TAB SAB Ect Mult Living                 Review of Systems  Constitutional:       Per HPI, otherwise negative  HENT:       Per HPI, otherwise negative  Respiratory:       Per HPI, otherwise negative  Cardiovascular:       Per HPI, otherwise negative  Gastrointestinal: Negative for vomiting.  Endocrine:       Negative aside from HPI  Genitourinary:       Neg aside from HPI   Musculoskeletal:       Per HPI, otherwise negative  Skin: Negative.   Neurological: Negative for syncope.    Allergies  Clindamycin/lincomycin and Latex  Home Medications   Current Outpatient Rx  Name  Route  Sig  Dispense  Refill  . ALBUTEROL IN   Inhalation   Inhale into the lungs.          BP 134/88  Pulse 82  Temp(Src)  98.1 F (36.7 C) (Oral)  Resp 20  Ht 5\' 5"  (1.651 m)  Wt 213 lb (96.616 kg)  BMI 35.44 kg/m2  SpO2 100%  LMP 04/01/2013 Physical Exam  Nursing note and vitals reviewed. Constitutional: She is oriented to person, place, and time. She appears well-developed and well-nourished. No distress.  HENT:  Head: Normocephalic and atraumatic.  Eyes: Conjunctivae and EOM are normal.  Cardiovascular: Normal rate and regular rhythm.   Pulmonary/Chest: Effort normal and breath sounds normal. No stridor. No respiratory distress.  Abdominal: She exhibits no distension. There is no tenderness.  Musculoskeletal: She exhibits no edema.  Neurological: She is alert and oriented to person, place, and time. No cranial nerve deficit.  Skin: Skin is warm and dry.  Psychiatric: She has a normal mood and affect.    ED Course  Procedures (including critical care time) Labs Review Labs Reviewed - No data to display Imaging Review No results found.  EKG Interpretation   None       MDM  No diagnosis found. Patient presents with multiple family members have similar concerns.  The patient's duration of symptoms she is not a candidate for Tamiflu.  Patient is afebrile, awake, alert, and in no distress.  She started on a new course of medication for presumed URI.  Absent distress, red flags, she is appropriate for further evaluation and management as an outpatient.    Gerhard Munchobert Vane Yapp, MD 04/03/13 1240

## 2013-04-03 NOTE — ED Notes (Signed)
Runny nose, cough, fever for 8 days.  Generalized aches and pains.  Some nausea.

## 2013-04-03 NOTE — Discharge Instructions (Signed)
As discussed, it is important that you follow up as soon as possible with your physician for continued management of your condition. ° °If you develop any new, or concerning changes in your condition, please return to the emergency department immediately. ° ° ° °Cough, Adult ° A cough is a reflex that helps clear your throat and airways. It can help heal the body or may be a reaction to an irritated airway. A cough may only last 2 or 3 weeks (acute) or may last more than 8 weeks (chronic).  °CAUSES °Acute cough: °· Viral or bacterial infections. °Chronic cough: °· Infections. °· Allergies. °· Asthma. °· Post-nasal drip. °· Smoking. °· Heartburn or acid reflux. °· Some medicines. °· Chronic lung problems (COPD). °· Cancer. °SYMPTOMS  °· Cough. °· Fever. °· Chest pain. °· Increased breathing rate. °· High-pitched whistling sound when breathing (wheezing). °· Colored mucus that you cough up (sputum). °TREATMENT  °· A bacterial cough may be treated with antibiotic medicine. °· A viral cough must run its course and will not respond to antibiotics. °· Your caregiver may recommend other treatments if you have a chronic cough. °HOME CARE INSTRUCTIONS  °· Only take over-the-counter or prescription medicines for pain, discomfort, or fever as directed by your caregiver. Use cough suppressants only as directed by your caregiver. °· Use a cold steam vaporizer or humidifier in your bedroom or home to help loosen secretions. °· Sleep in a semi-upright position if your cough is worse at night. °· Rest as needed. °· Stop smoking if you smoke. °SEEK IMMEDIATE MEDICAL CARE IF:  °· You have pus in your sputum. °· Your cough starts to worsen. °· You cannot control your cough with suppressants and are losing sleep. °· You begin coughing up blood. °· You have difficulty breathing. °· You develop pain which is getting worse or is uncontrolled with medicine. °· You have a fever. °MAKE SURE YOU:  °· Understand these instructions. °· Will  watch your condition. °· Will get help right away if you are not doing well or get worse. °Document Released: 08/31/2010 Document Revised: 05/27/2011 Document Reviewed: 08/31/2010 °[employer]® Patient Information ©2014 [employer]. ° °

## 2016-07-29 ENCOUNTER — Ambulatory Visit (HOSPITAL_BASED_OUTPATIENT_CLINIC_OR_DEPARTMENT_OTHER)
Admission: RE | Admit: 2016-07-29 | Discharge: 2016-07-29 | Disposition: A | Discharge status: Home / Self Care | Payer: Medicare Other | Source: Ambulatory Visit | Attending: Emergency Medicine | Admitting: Emergency Medicine

## 2016-07-29 ENCOUNTER — Other Ambulatory Visit (HOSPITAL_BASED_OUTPATIENT_CLINIC_OR_DEPARTMENT_OTHER): Payer: Self-pay | Admitting: Emergency Medicine

## 2016-07-29 DIAGNOSIS — M7122 Synovial cyst of popliteal space [Baker], left knee: Secondary | ICD-10-CM

## 2016-07-29 DIAGNOSIS — M7989 Other specified soft tissue disorders: Secondary | ICD-10-CM | POA: Insufficient documentation

## 2016-07-29 DIAGNOSIS — M25462 Effusion, left knee: Secondary | ICD-10-CM

## 2016-07-29 DIAGNOSIS — R52 Pain, unspecified: Secondary | ICD-10-CM

## 2016-07-29 DIAGNOSIS — M79605 Pain in left leg: Secondary | ICD-10-CM | POA: Diagnosis present

## 2016-09-20 ENCOUNTER — Encounter (HOSPITAL_BASED_OUTPATIENT_CLINIC_OR_DEPARTMENT_OTHER): Payer: Self-pay | Admitting: *Deleted

## 2016-09-20 ENCOUNTER — Emergency Department (HOSPITAL_BASED_OUTPATIENT_CLINIC_OR_DEPARTMENT_OTHER)
Admission: EM | Admit: 2016-09-20 | Discharge: 2016-09-20 | Disposition: A | Discharge status: Home / Self Care | Payer: Medicare Other | Attending: Emergency Medicine | Admitting: Emergency Medicine

## 2016-09-20 DIAGNOSIS — J45909 Unspecified asthma, uncomplicated: Secondary | ICD-10-CM | POA: Insufficient documentation

## 2016-09-20 DIAGNOSIS — Z79899 Other long term (current) drug therapy: Secondary | ICD-10-CM | POA: Insufficient documentation

## 2016-09-20 DIAGNOSIS — L03116 Cellulitis of left lower limb: Secondary | ICD-10-CM | POA: Diagnosis not present

## 2016-09-20 DIAGNOSIS — Z91040 Latex allergy status: Secondary | ICD-10-CM | POA: Diagnosis not present

## 2016-09-20 DIAGNOSIS — M79662 Pain in left lower leg: Secondary | ICD-10-CM | POA: Diagnosis present

## 2016-09-20 DIAGNOSIS — I10 Essential (primary) hypertension: Secondary | ICD-10-CM | POA: Diagnosis not present

## 2016-09-20 DIAGNOSIS — Z87891 Personal history of nicotine dependence: Secondary | ICD-10-CM | POA: Diagnosis not present

## 2016-09-20 HISTORY — DX: Sciatica, unspecified side: M54.30

## 2016-09-20 HISTORY — DX: Irritable bowel syndrome, unspecified: K58.9

## 2016-09-20 HISTORY — DX: Anemia, unspecified: D64.9

## 2016-09-20 HISTORY — DX: Torsion of ovary and ovarian pedicle, unspecified side: N83.519

## 2016-09-20 HISTORY — DX: Migraine, unspecified, not intractable, without status migrainosus: G43.909

## 2016-09-20 HISTORY — DX: Scoliosis, unspecified: M41.9

## 2016-09-20 HISTORY — DX: Unspecified osteoarthritis, unspecified site: M19.90

## 2016-09-20 MED ORDER — SULFAMETHOXAZOLE-TRIMETHOPRIM 800-160 MG PO TABS: 1.0000 | ORAL_TABLET | Freq: Two times a day (BID) | ORAL | 0 refills | Status: DC

## 2016-09-20 MED ORDER — SULFAMETHOXAZOLE-TRIMETHOPRIM 800-160 MG PO TABS: 1.0000 | ORAL_TABLET | Freq: Two times a day (BID) | ORAL | 0 refills | Status: AC

## 2016-09-20 MED ORDER — PROMETHAZINE HCL 25 MG PO TABS: 25.0000 mg | ORAL_TABLET | Freq: Four times a day (QID) | ORAL | 0 refills | Status: DC | PRN

## 2016-09-20 MED ORDER — CEPHALEXIN 500 MG PO CAPS: 500.0000 mg | ORAL_CAPSULE | Freq: Four times a day (QID) | ORAL | 0 refills | Status: DC

## 2016-09-20 MED FILL — SULFAMETHOXAZOLE/TMP DS TAB: 800-160 | 7 days supply | Qty: 14 | Fill #0

## 2016-09-20 MED FILL — CEPHALEXIN 500 MG CAPSULE: 500 | 10 days supply | Qty: 40 | Fill #0

## 2016-09-20 MED FILL — PROMETHAZINE 25 MG TABLET: 25 | 3 days supply | Qty: 10 | Fill #0

## 2016-09-20 NOTE — ED Triage Notes (Signed)
Pt has redness and swelling to L lower, anterior leg. States it was there prior to knee surgery on 09-09-16. Reports fever (high of 100). Also reports fatigue, cough, n/v x4days. Pt ambulating with cane from home. Denies sob, chest pain.

## 2016-09-20 NOTE — ED Notes (Signed)
ED Provider at bedside. 

## 2016-09-20 NOTE — Discharge Instructions (Signed)
Warm compresses 4 x a day.  Return for rapid spreading redness or high fever.

## 2016-09-20 NOTE — ED Notes (Signed)
Pt refused wheelchair.  

## 2016-09-20 NOTE — ED Provider Notes (Signed)
[facility] Note   CSN: [id number] Arrival date & time: 09/20/16  0732     History   Chief Complaint Chief Complaint  Patient presents with  . Leg Pain    HPI Elaine Mills is a 41 y.o. female.  40 yo F with a chief complaint of left leg pain. She recently had a arthroscopically of her left knee. Has been doing well from that standpoint but has a new area to the left lower leg that is been painful and red. She thinks maybe she was bit by a spider. Has multiple small pruritic lesions to her toes. She had a low-grade fever last night. Had some nausea and vomiting a few days ago.   The history is provided by the patient.  Leg Pain   This is a new problem. The current episode started 2 days ago. The problem occurs constantly. The problem has been gradually worsening. The pain is present in the left lower leg. The quality of the pain is described as aching. The pain is at a severity of 5/10. The pain is moderate. Associated symptoms include stiffness. Pertinent negatives include no numbness. The symptoms are aggravated by activity. She has tried cold for the symptoms. The treatment provided no relief. There has been no history of extremity trauma.    Past Medical History:  Diagnosis Date  . Anemia   . Anxiety   . Arthritis   . Asthma   . Bipolar 1 disorder (HCC)   . Hiatal hernia   . Hypertension   . IBS (irritable bowel syndrome)   . Lupus   . Migraines   . Ovarian torsion   . Panic attack   . Sciatica   . Scoliosis     Patient Active Problem List   Diagnosis Date Noted  . OVARIAN CYST 11/10/2008  . COUGH 11/10/2008  . BIPOLAR DISORDER UNSPECIFIED 11/09/2008  . ASTHMA 11/09/2008  . GASTROESOPHAGEAL REFLUX DISEASE 11/09/2008    Past Surgical History:  Procedure Laterality Date  . BREAST SURGERY    . CHOLECYSTECTOMY    . HERNIA REPAIR    . KNEE SURGERY Left     OB History    No data available       Home Medications    Prior to  Admission medications   Medication Sig Start Date End Date Taking? Authorizing Provider  albuterol (PROVENTIL HFA;VENTOLIN HFA) 108 (90 BASE) MCG/ACT inhaler Inhale 1-2 puffs into the lungs every 6 (six) hours as needed for wheezing or shortness of breath. 04/03/13  Yes Gerhard Munch, MD  ALPRAZolam (XANAX PO) Take by mouth.   Yes [provider]  AmLODIPine Besylate (NORVASC PO) Take by mouth.   Yes [provider]  fluticasone (FLONASE) 50 MCG/ACT nasal spray Place into both nostrils daily.   Yes [provider]  HYDROCHLOROTHIAZIDE PO Take by mouth.   Yes [provider]  HYDROCODONE-ACETAMINOPHEN PO Take by mouth.   Yes [provider]  Metoclopramide HCl (REGLAN PO) Take by mouth.   Yes [provider]  METOPROLOL TARTRATE PO Take by mouth.   Yes [provider]  Omeprazole (PRILOSEC PO) Take by mouth.   Yes [provider]  OXYCODONE ER PO Take by mouth.   Yes [provider]  TRAZODONE HCL PO Take by mouth.   Yes [provider]  Zolpidem Tartrate (AMBIEN PO) Take by mouth.   Yes [provider]  ALBUTEROL IN Inhale into the lungs.    [provider]  cephALEXin (KEFLEX) 500 MG capsule Take 1 capsule (500 mg total) by mouth 4 (four) times daily. 09/20/16   Melene Plan, DO  chlorpheniramine-HYDROcodone (TUSSIONEX PENNKINETIC ER) 10-8 MG/5ML LQCR Take 5 mLs by mouth every 12 (twelve) hours as needed for cough. 04/03/13   Gerhard Munch, MD  promethazine (PHENERGAN) 25 MG tablet Take 1 tablet (25 mg total) by mouth every 6 (six) hours as needed for nausea or vomiting. 09/20/16   Melene Plan, DO  sulfamethoxazole-trimethoprim (BACTRIM DS,SEPTRA DS) 800-160 MG tablet Take 1 tablet by mouth 2 (two) times daily. 09/20/16 09/27/16  Melene Plan, DO    Family History No family history on file.  Social History Social History  Substance Use Topics  . Smoking status: Former Games developer  . Smokeless  tobacco: Never Used  . Alcohol use Yes     Comment: occ     Allergies   Adhesive [tape]; Clindamycin/lincomycin; and Latex   Review of Systems Review of Systems  Constitutional: Negative for chills and fever.  HENT: Negative for congestion and rhinorrhea.   Eyes: Negative for redness and visual disturbance.  Respiratory: Negative for shortness of breath and wheezing.   Cardiovascular: Negative for chest pain and palpitations.  Gastrointestinal: Negative for nausea and vomiting.  Genitourinary: Negative for dysuria and urgency.  Musculoskeletal: Positive for stiffness. Negative for arthralgias and myalgias.  Skin: Positive for color change and wound. Negative for pallor.  Neurological: Negative for dizziness, numbness and headaches.     Physical Exam Updated Vital Signs BP 113/75 (BP Location: Left Arm)   Pulse 92   Temp 98.8 F (37.1 C) (Oral)   Resp 20   Ht 5\' 5"  (1.651 m)   Wt 95.3 kg (210 lb)   LMP 09/01/2016 (Exact Date)   SpO2 98%   BMI 34.95 kg/m   Physical Exam  Constitutional: She is oriented to person, place, and time. She appears well-developed and well-nourished. No distress.  HENT:  Head: Normocephalic and atraumatic.  Eyes: EOM are normal. Pupils are equal, round, and reactive to light.  Neck: Normal range of motion. Neck supple.  Cardiovascular: Normal rate and regular rhythm.  Exam reveals no gallop and no friction rub.   No murmur heard. Pulmonary/Chest: Effort normal. She has no wheezes. She has no rales.  Abdominal: Soft. She exhibits no distension. There is no tenderness.  Musculoskeletal: She exhibits edema. She exhibits no tenderness.  Area of erythema to the left anterior shin well below the knee. Mild fluctuance. Pulse motor and sensation is intact distally. Mild pain with range of motion of the left knee. There is no erythema warmth to the left knee. Small effusion.  Neurological: She is alert and oriented to person, place, and time.  Skin:  Skin is warm and dry. She is not diaphoretic.  Psychiatric: She has a normal mood and affect. Her behavior is normal.  Nursing note and vitals reviewed.    ED Treatments / Results  Labs (all labs ordered are listed, but only abnormal results are displayed) Labs Reviewed - No data to display  EKG  EKG Interpretation None       Radiology No results found.  Procedures Procedures (including critical care time) Emergency Focused Ultrasound Exam Limited Ultrasound of Soft Tissue   Performed and interpreted by Dr. Adela Lank Indication: evaluation for infection or foreign body Transverse and Sagittal views of left lower leg are obtained in real time for the purposes of evaluation of skin and underlying soft tissues.  Findings:  no heterogeneous fluid collection, with hyperemia/edema of surrounding tissue Interpretation: no abscess, with cellulitis Images archived electronically.  CPT Codes:    Lower extremity [phone]     Medications Ordered in ED Medications - No data to display   Initial Impression / Assessment and Plan / ED Course  I have reviewed the triage vital signs and the nursing notes.  Pertinent labs & imaging results that were available during my care of the patient were reviewed by me and considered in my medical decision making (see chart for details).     41 yo F With a chief complaints of left leg pain and erythema. Appears to be localized area of cellulitis. This could also be a localized reaction from insect bite. We'll cover her with antibiotics. There is no drainable fluid collection seen on ultrasound. She is requesting a refill of her home Phenergan. Discharge home.  8:16 AM:  I have discussed the diagnosis/risks/treatment options with the patient and family and believe the pt to be eligible for discharge home to follow-up with PCP. We also discussed returning to the ED immediately if new or worsening sx occur. We discussed the sx which are most  concerning (e.g., sudden worsening pain, fever, inability to tolerate by mouth) that necessitate immediate return. Medications administered to the patient during their visit and any new prescriptions provided to the patient are listed below.  Medications given during this visit Medications - No data to display   The patient appears reasonably screen and/or stabilized for discharge and I doubt any other medical condition or other [facility] requiring further screening, evaluation, or treatment in the ED at this time prior to discharge.    Final Clinical Impressions(s) / ED Diagnoses   Final diagnoses:  Cellulitis of left lower extremity    New Prescriptions Current Discharge Medication List    START taking these medications   Details  cephALEXin (KEFLEX) 500 MG capsule Take 1 capsule (500 mg total) by mouth 4 (four) times daily. Qty: 40 capsule, Refills: 0    promethazine (PHENERGAN) 25 MG tablet Take 1 tablet (25 mg total) by mouth every 6 (six) hours as needed for nausea or vomiting. Qty: 10 tablet, Refills: 0    sulfamethoxazole-trimethoprim (BACTRIM DS,SEPTRA DS) 800-160 MG tablet Take 1 tablet by mouth 2 (two) times daily. Qty: 14 tablet, Refills: 0         Melene PlanFloyd, Atavia Poppe, DO 09/20/16 [phone]

## 2016-10-02 ENCOUNTER — Emergency Department (HOSPITAL_BASED_OUTPATIENT_CLINIC_OR_DEPARTMENT_OTHER): Payer: Medicare Other

## 2016-10-02 ENCOUNTER — Emergency Department (HOSPITAL_BASED_OUTPATIENT_CLINIC_OR_DEPARTMENT_OTHER)
Admission: EM | Admit: 2016-10-02 | Discharge: 2016-10-02 | Disposition: A | Discharge status: Home / Self Care | Payer: Medicare Other | Attending: Emergency Medicine | Admitting: Emergency Medicine

## 2016-10-02 ENCOUNTER — Encounter (HOSPITAL_BASED_OUTPATIENT_CLINIC_OR_DEPARTMENT_OTHER): Payer: Self-pay | Admitting: Emergency Medicine

## 2016-10-02 DIAGNOSIS — R1084 Generalized abdominal pain: Secondary | ICD-10-CM | POA: Insufficient documentation

## 2016-10-02 DIAGNOSIS — E860 Dehydration: Secondary | ICD-10-CM | POA: Diagnosis not present

## 2016-10-02 DIAGNOSIS — R531 Weakness: Secondary | ICD-10-CM

## 2016-10-02 DIAGNOSIS — I10 Essential (primary) hypertension: Secondary | ICD-10-CM | POA: Diagnosis not present

## 2016-10-02 DIAGNOSIS — J45909 Unspecified asthma, uncomplicated: Secondary | ICD-10-CM | POA: Insufficient documentation

## 2016-10-02 DIAGNOSIS — R110 Nausea: Secondary | ICD-10-CM | POA: Insufficient documentation

## 2016-10-02 DIAGNOSIS — R42 Dizziness and giddiness: Secondary | ICD-10-CM | POA: Diagnosis present

## 2016-10-02 DIAGNOSIS — R197 Diarrhea, unspecified: Secondary | ICD-10-CM

## 2016-10-02 DIAGNOSIS — G44209 Tension-type headache, unspecified, not intractable: Secondary | ICD-10-CM | POA: Diagnosis not present

## 2016-10-02 DIAGNOSIS — Z91040 Latex allergy status: Secondary | ICD-10-CM | POA: Insufficient documentation

## 2016-10-02 HISTORY — DX: Cervicalgia: M54.2

## 2016-10-02 HISTORY — DX: Pain in unspecified knee: M25.569

## 2016-10-02 HISTORY — DX: Obstructive sleep apnea (adult) (pediatric): G47.33

## 2016-10-02 HISTORY — DX: Borderline personality disorder: F60.3

## 2016-10-02 HISTORY — DX: Other chronic pain: G89.29

## 2016-10-02 HISTORY — DX: Fibromyalgia: M79.7

## 2016-10-02 HISTORY — DX: Dorsalgia, unspecified: M54.9

## 2016-10-02 HISTORY — DX: Post-traumatic stress disorder, unspecified: F43.10

## 2016-10-02 LAB — CBC WITH DIFFERENTIAL/PLATELET
BASOS PCT: 0 %
Basophils Absolute: 0 10*3/uL (ref 0.0–0.1)
EOS PCT: 0 %
Eosinophils Absolute: 0 10*3/uL (ref 0.0–0.7)
HEMATOCRIT: 39.1 % (ref 36.0–46.0)
Hemoglobin: 12.8 g/dL (ref 12.0–15.0)
Lymphocytes Relative: 13 %
Lymphs Abs: 1.4 10*3/uL (ref 0.7–4.0)
MCH: 25.2 pg — ABNORMAL LOW (ref 26.0–34.0)
MCHC: 32.7 g/dL (ref 30.0–36.0)
MCV: 77.1 fL — AB (ref 78.0–100.0)
MONO ABS: 0.6 10*3/uL (ref 0.1–1.0)
MONOS PCT: 6 %
NEUTROS ABS: 8.6 10*3/uL — AB (ref 1.7–7.7)
Neutrophils Relative %: 81 %
Platelets: 359 10*3/uL (ref 150–400)
RBC: 5.07 MIL/uL (ref 3.87–5.11)
RDW: 15.5 % (ref 11.5–15.5)
WBC: 10.7 10*3/uL — ABNORMAL HIGH (ref 4.0–10.5)

## 2016-10-02 LAB — ETHANOL

## 2016-10-02 LAB — URINALYSIS, ROUTINE W REFLEX MICROSCOPIC
Bilirubin Urine: NEGATIVE
Glucose, UA: NEGATIVE mg/dL
Ketones, ur: NEGATIVE mg/dL
LEUKOCYTES UA: NEGATIVE
Nitrite: NEGATIVE
Protein, ur: NEGATIVE mg/dL
Specific Gravity, Urine: 1.005 (ref 1.005–1.030)
pH: 6.5 (ref 5.0–8.0)

## 2016-10-02 LAB — RAPID URINE DRUG SCREEN, HOSP PERFORMED
Amphetamines: NOT DETECTED
Barbiturates: NOT DETECTED
Benzodiazepines: POSITIVE — AB
Cocaine: NOT DETECTED
Opiates: NOT DETECTED
Tetrahydrocannabinol: NOT DETECTED

## 2016-10-02 LAB — COMPREHENSIVE METABOLIC PANEL
ALK PHOS: 79 U/L (ref 38–126)
ALT: 66 U/L — AB (ref 14–54)
AST: 38 U/L (ref 15–41)
Albumin: 3.8 g/dL (ref 3.5–5.0)
Anion gap: 10 (ref 5–15)
BILIRUBIN TOTAL: 0.9 mg/dL (ref 0.3–1.2)
BUN: 11 mg/dL (ref 6–20)
CALCIUM: 8.9 mg/dL (ref 8.9–10.3)
CHLORIDE: 103 mmol/L (ref 101–111)
CO2: 25 mmol/L (ref 22–32)
CREATININE: 0.8 mg/dL (ref 0.44–1.00)
GFR calc Af Amer: >60 mL/min (ref >60)
GFR calc non Af Amer: >60 mL/min (ref >60)
Glucose, Bld: 110 mg/dL — ABNORMAL HIGH (ref 65–99)
Potassium: 3.7 mmol/L (ref 3.5–5.1)
Sodium: 138 mmol/L (ref 135–145)
Total Protein: 8.1 g/dL (ref 6.5–8.1)

## 2016-10-02 LAB — LIPASE, BLOOD: Lipase: 29 U/L (ref 11–51)

## 2016-10-02 LAB — URINALYSIS, MICROSCOPIC (REFLEX)

## 2016-10-02 LAB — TROPONIN I

## 2016-10-02 MED ORDER — PROMETHAZINE HCL 25 MG/ML IJ SOLN
12.5000 mg | Freq: Once | INTRAMUSCULAR | Status: AC
  Administered 2016-10-02: 12.5 mg via INTRAVENOUS
  Filled 2016-10-02: qty 1

## 2016-10-02 MED ORDER — PROMETHAZINE HCL 25 MG PO TABS: 25.0000 mg | ORAL_TABLET | Freq: Four times a day (QID) | ORAL | 0 refills | Status: DC | PRN

## 2016-10-02 MED ORDER — DICYCLOMINE HCL 20 MG PO TABS: 20.0000 mg | ORAL_TABLET | Freq: Two times a day (BID) | ORAL | 0 refills | Status: DC

## 2016-10-02 MED ORDER — SUCRALFATE 1 GM/10ML PO SUSP: 1.0000 g | Freq: Three times a day (TID) | ORAL | 0 refills | Status: DC

## 2016-10-02 MED ORDER — SODIUM CHLORIDE 0.9 % IV BOLUS (SEPSIS)
1000.0000 mL | Freq: Once | INTRAVENOUS | Status: AC
  Administered 2016-10-02: 1000 mL via INTRAVENOUS

## 2016-10-02 MED ORDER — IBUPROFEN 800 MG PO TABS: 800.0000 mg | ORAL_TABLET | Freq: Three times a day (TID) | ORAL | 0 refills | Status: DC | PRN

## 2016-10-02 MED FILL — IBUPROFEN 800 MG TABLET: 800 | 7 days supply | Qty: 21 | Fill #0

## 2016-10-02 MED FILL — DICYCLOMINE 20 MG TABLET: 20 | 10 days supply | Qty: 20 | Fill #0

## 2016-10-02 MED FILL — CARAFATE 1 GM/10 ML SUSP: 1 | 11 days supply | Qty: 420 | Fill #0

## 2016-10-02 NOTE — Discharge Instructions (Signed)
You have been seen in the Emergency Department (ED) today for nausea and vomiting.  Your work up today has not shown a clear cause for your symptoms. You have been prescribed Phenergan; please use as prescribed as needed for your nausea.  We prescribed other medications for abdominal discomfort. This is likely due to a viral illness and you will need to rest and stay hydrated.   Follow up with your doctor as soon as possible regarding today?s emergent visit and your symptoms of nausea.   Follow up with your neurologist if the speech problems and forgetfulness continue. I have listed a phone number below.   Return to the ED if you develop abdominal, bloody vomiting, bloody diarrhea, if you are unable to tolerate fluids due to vomiting, or if you develop other symptoms that concern you.

## 2016-10-02 NOTE — ED Notes (Signed)
Patient transported to CT 

## 2016-10-02 NOTE — ED Provider Notes (Signed)
Emergency Department Provider Note   I have reviewed the triage vital signs and the nursing notes.   HISTORY  Chief Complaint Dizziness and Diarrhea   HPI Elaine Mills is a 41 y.o. female with PMH of anxiety, Bipolar, Fibromyalgia, IBS, and PTSD presents to the emergency department for evaluation of generalized weakness, speech changes, headache, lightheadedness, shaking chills, nausea, generalized abdominal discomfort, and dyspnea. Symptoms began 3 days ago and seemed to be gradually worsening. She was seen by her primary care physician and in the emergency department recently. She's been taking Phenergan for nausea which allowed her to eat and drink without vomiting. Patient's husband reports that her primary care physician told her that her iron was very low and they have tried iron rich foods at home. Patient denies any fevers. She denies chest pain. She feels like her speech is different in that she is taking longer to say what she wants to say and feels like she has difficulty processing what other people are saying. The husband states that she is frequently "spacing out." Patient states she's been compliant with her home medications with no changes recently. She denies any alcohol or drug use. No recent head injuries. No radiation of symptoms. No modifying factors. No blood in the stool.    Past Medical History:  Diagnosis Date  . Anemia   . Anxiety   . Arthritis   . Asthma   . Bipolar 1 disorder (HCC)   . Borderline personality disorder   . Chronic back pain   . Chronic knee pain   . Fibromyalgia   . Hiatal hernia   . Hypertension   . IBS (irritable bowel syndrome)   . Lupus   . Migraines   . Neck pain   . Obstructive sleep apnea   . Ovarian torsion   . Panic attack   . PTSD (post-traumatic stress disorder)   . Sciatica   . Scoliosis     Patient Active Problem List   Diagnosis Date Noted  . OVARIAN CYST 11/10/2008  . COUGH 11/10/2008  . BIPOLAR DISORDER  UNSPECIFIED 11/09/2008  . ASTHMA 11/09/2008  . GASTROESOPHAGEAL REFLUX DISEASE 11/09/2008    Past Surgical History:  Procedure Laterality Date  . BREAST SURGERY    . CHOLECYSTECTOMY    . HERNIA REPAIR    . KNEE SURGERY Left     Current Outpatient Rx  . Order #: [id number] Class: Print  . Order #: [id number] Class: Historical Med  . Order #: [id number] Class: Historical Med  . Order #: [id number] Class: Historical Med  . Order #: [id number] Class: Historical Med  . Order #: [id number] Class: Historical Med  . Order #: [id number] Class: Historical Med  . Order #: [id number] Class: Historical Med  . Order #: [id number] Class: Historical Med  . Order #: [id number] Class: Historical Med  . Order #: [id number] Class: Historical Med  . Order #: [id number] Class: Historical Med  . Order #: [id number] Class: Historical Med  . Order #: [id number] Class: Historical Med  . Order #: [id number] Class: Historical Med  . Order #: [id number] Class: Historical Med  . Order #: [id number] Class: Historical Med  . Order #: [id number] Class: Historical Med  . Order #: [id number] Class: Historical Med  . Order #: [id number] Class: Historical Med  . Order #: [id number] Class: Historical Med  . Order #: [id number] Class: Historical Med  . Order #: [id number] Class: Historical Med  . Order #: [id number] Class: Historical Med  . Order #: [id number] Class: Print  . Order #: [id number] Class: Print  . Order #: [id number] Class: Print  . Order #: [id number] Class: Print  .  Order #: [id number] Class: Print    Allergies Adhesive [tape]; Clindamycin/lincomycin; and Latex  No family history on file.  Social History Social History  Substance Use Topics  . Smoking status: Former Games developer  . Smokeless tobacco: Never Used  . Alcohol use Yes     Comment: occ    Review of Systems  Constitutional: No fever. Positive chills.  Eyes: No visual changes. ENT: No sore throat.  Cardiovascular: Denies chest pain. Respiratory: Positive shortness of  breath. Gastrointestinal: Positive diffuse abdominal pain. Positive nausea, vomiting, and diarrhea.  No constipation. Genitourinary: Negative for dysuria. Musculoskeletal: Negative for back pain. Skin: Negative for rash. Neurological: Negative for focal weakness or numbness. Positive HA and speech changes.   10-point ROS otherwise negative.  ____________________________________________   PHYSICAL EXAM:  VITAL SIGNS: ED Triage Vitals  Enc Vitals Group     BP 10/02/16 0913 119/90     Pulse Rate 10/02/16 0913 83     Resp 10/02/16 0913 20     Temp 10/02/16 0913 98.5 F (36.9 C)     Temp Source 10/02/16 0913 Oral     SpO2 10/02/16 0913 100 %     Weight 10/02/16 0914 202 lb (91.6 kg)     Height 10/02/16 0914 5\' 5"  (1.651 m)     Pain Score 10/02/16 0912 8   Constitutional: Alert and oriented. Speaking normally and giving full history. Does appear moderately anxious during conversation.  Eyes: Conjunctivae are normal. Head: Atraumatic. Nose: No congestion/rhinnorhea. Mouth/Throat: Mucous membranes are dry.  Neck: No stridor.   Cardiovascular: Normal rate, regular rhythm. Good peripheral circulation. Grossly normal heart sounds.   Respiratory: Normal respiratory effort.  No retractions. Lungs CTAB. Gastrointestinal: Soft and nontender. No distention.  Musculoskeletal: No lower extremity tenderness nor edema. Mild swollen area to anterior left lower extremity. No bruising or erythema. No fluctuance.  Neurologic:  Normal speech and language. No gross focal neurologic deficits are appreciated. No pronator drift. Normal CN exam 2-12.  Skin:  Skin is warm, dry and intact. No rash noted. Psychiatric: Mood and affect are slightly anxious. Speech and behavior are normal.  ____________________________________________   LABS (all labs ordered are listed, but only abnormal results are displayed)  Labs Reviewed  COMPREHENSIVE METABOLIC PANEL - Abnormal; Notable for the following:        Result Value   Glucose, Bld 110 (*)    ALT 66 (*)    All other components within normal limits  CBC WITH DIFFERENTIAL/PLATELET - Abnormal; Notable for the following:    WBC 10.7 (*)    MCV 77.1 (*)    MCH 25.2 (*)    Neutro Abs 8.6 (*)    All other components within normal limits  RAPID URINE DRUG SCREEN, HOSP PERFORMED - Abnormal; Notable for the following:    Benzodiazepines POSITIVE (*)    All other components within normal limits  URINALYSIS, ROUTINE W REFLEX MICROSCOPIC - Abnormal; Notable for the following:    Hgb urine dipstick LARGE (*)    All other components within normal limits  URINALYSIS, MICROSCOPIC (REFLEX) - Abnormal; Notable for the following:    Bacteria, UA RARE (*)    Squamous Epithelial / LPF 0-5 (*)    All other components within normal limits  ETHANOL  LIPASE, BLOOD  TROPONIN I   ____________________________________________  EKG   EKG Interpretation  Date/Time:  Wednesday October 02 2016 09:14:56 EDT Ventricular Rate:  86 PR Interval:    QRS Duration: 123 QT Interval:  431  QTC Calculation: 513 R Axis:   63 Text Interpretation:  Sinus rhythm Short PR interval Nonspecific intraventricular conduction delay Borderline T abnormalities, inferior leads Baseline wander in lead(s) V1 V2 V6 No STEMI.  Confirmed by Alona Bene [id number]) on 10/02/2016 9:20:17 AM       ____________________________________________  RADIOLOGY  Dg Chest 2 View  Result Date: 10/02/2016 CLINICAL DATA:  Fevers EXAM: CHEST  2 VIEW COMPARISON:  09/03/2016 FINDINGS: The heart size and mediastinal contours are within normal limits. Both lungs are clear. The visualized skeletal structures are unremarkable. IMPRESSION: No active cardiopulmonary disease. Electronically Signed   By: Alcide Clever M.D.   On: 10/02/2016 10:29   Ct Head Wo Contrast  Result Date: 10/02/2016 CLINICAL DATA:  41 year old with headache, dizziness, slurred speech, fever and fatigue for 3 days. EXAM: CT HEAD WITHOUT  CONTRAST TECHNIQUE: Contiguous axial images were obtained from the base of the skull through the vertex without intravenous contrast. COMPARISON:  CT head 04/28/2008 FINDINGS: Brain: There is no evidence of acute intracranial hemorrhage, mass lesion, brain edema or extra-axial fluid collection. The ventricles and subarachnoid spaces are appropriately sized for age. There is no CT evidence of acute cortical infarction. Vascular: No hyperdense vessel or unexpected calcification. Skull: Negative for fracture or focal lesion. Sinuses/Orbits: The visualized paranasal sinuses and mastoid air cells are clear. No orbital abnormalities are seen. Other: None. IMPRESSION: Negative noncontrast head CT. Electronically Signed   By: Carey Bullocks M.D.   On: 10/02/2016 10:45    ____________________________________________   PROCEDURES  Procedure(s) performed:   Procedures  None ____________________________________________   INITIAL IMPRESSION / ASSESSMENT AND PLAN / ED COURSE  Pertinent labs & imaging results that were available during my care of the patient were reviewed by me and considered in my medical decision making (see chart for details).  Patient presents to the emergency department for evaluation of multiple medical complaints outlined above. Symptoms been ongoing for the last 3 days. No focal deficits on my neuro exam. The patient appears to have normal speech here in the emergency department. Patient has had some gastrointestinal symptoms as well as dyspnea. She's describing lightheadedness and generalized weakness in the setting of diarrhea. Plan for labs, UA, IV fluids along with orthostatic vital signs. Given her complaint of speech changes and generalized weakness plan to obtain a CT scan of the head for further evaluation. She is also complaining of dyspnea so follow chest x-ray to rule out infiltrative process.   11:15 AM Patient labs and imaging reviewed with no acute abnormality.  Suspect that the constellation of symptoms is most likely secondary to a viral GI illness. I do not appreciate any neurological deficit to suspect stroke especially in the setting of many other unrelated medical complaints. I did provide contact information for both her primary care physician and an outpatient neurologist if her speech problem continues but I cannot appreciate any aphasia. Plan for supportive care at home and PCP follow up. Patient is standing and ambulatory in the ED prior to discharge without difficulty.   At this time, I do not feel there is any life-threatening condition present. I have reviewed and discussed all results (EKG, imaging, lab, urine as appropriate), exam findings with patient. I have reviewed nursing notes and appropriate previous records.  I feel the patient is safe to be discharged home without further emergent workup. Discussed usual and customary return precautions. Patient and family (if present) verbalize understanding and are comfortable with this plan.  Patient will follow-up  with their primary care provider. If they do not have a primary care provider, information for follow-up has been provided to them. All questions have been answered.  ____________________________________________  FINAL CLINICAL IMPRESSION(S) / ED DIAGNOSES  Final diagnoses:  Dehydration  Generalized weakness  Generalized abdominal pain  Diarrhea, unspecified type  Nausea  Acute non intractable tension-type headache     MEDICATIONS GIVEN DURING THIS VISIT:  Medications  sodium chloride 0.9 % bolus 1,000 mL (0 mLs Intravenous Stopped 10/02/16 1044)  promethazine (PHENERGAN) injection 12.5 mg (12.5 mg Intravenous Given 10/02/16 0950)     NEW OUTPATIENT MEDICATIONS STARTED DURING THIS VISIT:  New Prescriptions   DICYCLOMINE (BENTYL) 20 MG TABLET    Take 1 tablet (20 mg total) by mouth 2 (two) times daily.   IBUPROFEN (ADVIL,MOTRIN) 800 MG TABLET    Take 1 tablet (800 mg total)  by mouth every 8 (eight) hours as needed.   PROMETHAZINE (PHENERGAN) 25 MG TABLET    Take 1 tablet (25 mg total) by mouth every 6 (six) hours as needed for nausea or vomiting.   SUCRALFATE (CARAFATE) 1 GM/10ML SUSPENSION    Take 10 mLs (1 g total) by mouth 4 (four) times daily -  with meals and at bedtime.      Note:  This document was prepared using Dragon voice recognition software and may include unintentional dictation errors.  Alona Bene, MD Emergency Medicine   Christy Ehrsam, Arlyss Repress, MD 10/02/16 [phone]

## 2016-10-02 NOTE — ED Triage Notes (Addendum)
Pt has multiple complaints over the last 2 days: fatigue, diarrhea, "fainting", dizziness, "can't talk" (no apparent speech difficulty during triage); abd pain, chills, HA

## 2016-10-31 ENCOUNTER — Emergency Department (HOSPITAL_BASED_OUTPATIENT_CLINIC_OR_DEPARTMENT_OTHER)
Admission: EM | Admit: 2016-10-31 | Discharge: 2016-10-31 | Disposition: A | Discharge status: Home / Self Care | Payer: Medicare Other | Attending: Emergency Medicine | Admitting: Emergency Medicine

## 2016-10-31 ENCOUNTER — Encounter (HOSPITAL_BASED_OUTPATIENT_CLINIC_OR_DEPARTMENT_OTHER): Payer: Self-pay | Admitting: Emergency Medicine

## 2016-10-31 DIAGNOSIS — Z87891 Personal history of nicotine dependence: Secondary | ICD-10-CM | POA: Diagnosis not present

## 2016-10-31 DIAGNOSIS — F419 Anxiety disorder, unspecified: Secondary | ICD-10-CM

## 2016-10-31 DIAGNOSIS — F308 Other manic episodes: Secondary | ICD-10-CM | POA: Diagnosis not present

## 2016-10-31 DIAGNOSIS — Z91040 Latex allergy status: Secondary | ICD-10-CM | POA: Diagnosis not present

## 2016-10-31 DIAGNOSIS — R410 Disorientation, unspecified: Secondary | ICD-10-CM

## 2016-10-31 DIAGNOSIS — F332 Major depressive disorder, recurrent severe without psychotic features: Secondary | ICD-10-CM | POA: Diagnosis not present

## 2016-10-31 DIAGNOSIS — I10 Essential (primary) hypertension: Secondary | ICD-10-CM | POA: Insufficient documentation

## 2016-10-31 DIAGNOSIS — Z6379 Other stressful life events affecting family and household: Secondary | ICD-10-CM | POA: Diagnosis not present

## 2016-10-31 DIAGNOSIS — Z79899 Other long term (current) drug therapy: Secondary | ICD-10-CM | POA: Diagnosis not present

## 2016-10-31 DIAGNOSIS — J45909 Unspecified asthma, uncomplicated: Secondary | ICD-10-CM | POA: Diagnosis not present

## 2016-10-31 DIAGNOSIS — R4182 Altered mental status, unspecified: Secondary | ICD-10-CM | POA: Diagnosis present

## 2016-10-31 LAB — ETHANOL: Alcohol, Ethyl (B): <5 mg/dL (ref <5)

## 2016-10-31 LAB — CBC WITH DIFFERENTIAL/PLATELET
BASOS ABS: 0 10*3/uL (ref 0.0–0.1)
Basophils Relative: 0 %
EOS ABS: 0.1 10*3/uL (ref 0.0–0.7)
EOS PCT: 1 %
HCT: 38.8 % (ref 36.0–46.0)
HEMOGLOBIN: 12.3 g/dL (ref 12.0–15.0)
LYMPHS PCT: 17 %
Lymphs Abs: 1.8 10*3/uL (ref 0.7–4.0)
MCH: 25 pg — ABNORMAL LOW (ref 26.0–34.0)
MCHC: 31.7 g/dL (ref 30.0–36.0)
MCV: 78.9 fL (ref 78.0–100.0)
Monocytes Absolute: 0.7 10*3/uL (ref 0.1–1.0)
Monocytes Relative: 6 %
NEUTROS PCT: 76 %
Neutro Abs: 8.2 10*3/uL — ABNORMAL HIGH (ref 1.7–7.7)
PLATELETS: 330 10*3/uL (ref 150–400)
RBC: 4.92 MIL/uL (ref 3.87–5.11)
RDW: 16.6 % — ABNORMAL HIGH (ref 11.5–15.5)
WBC: 10.7 10*3/uL — AB (ref 4.0–10.5)

## 2016-10-31 LAB — PREGNANCY, URINE: Preg Test, Ur: NEGATIVE

## 2016-10-31 LAB — COMPREHENSIVE METABOLIC PANEL
ALBUMIN: 3.8 g/dL (ref 3.5–5.0)
ALT: 25 U/L (ref 14–54)
AST: 17 U/L (ref 15–41)
Alkaline Phosphatase: 83 U/L (ref 38–126)
Anion gap: 8 (ref 5–15)
BILIRUBIN TOTAL: 0.2 mg/dL — AB (ref 0.3–1.2)
BUN: 11 mg/dL (ref 6–20)
CO2: 26 mmol/L (ref 22–32)
CREATININE: 0.75 mg/dL (ref 0.44–1.00)
Calcium: 8.9 mg/dL (ref 8.9–10.3)
Chloride: 103 mmol/L (ref 101–111)
GFR calc Af Amer: >60 mL/min (ref >60)
GLUCOSE: 94 mg/dL (ref 65–99)
POTASSIUM: 4.1 mmol/L (ref 3.5–5.1)
Sodium: 137 mmol/L (ref 135–145)
TOTAL PROTEIN: 7.5 g/dL (ref 6.5–8.1)

## 2016-10-31 LAB — RAPID URINE DRUG SCREEN, HOSP PERFORMED
AMPHETAMINES: NOT DETECTED
BARBITURATES: POSITIVE — AB
BENZODIAZEPINES: POSITIVE — AB
Cocaine: NOT DETECTED
Opiates: NOT DETECTED
Tetrahydrocannabinol: NOT DETECTED

## 2016-10-31 LAB — URINALYSIS, ROUTINE W REFLEX MICROSCOPIC
BILIRUBIN URINE: NEGATIVE
Glucose, UA: NEGATIVE mg/dL
Hgb urine dipstick: NEGATIVE
Ketones, ur: NEGATIVE mg/dL
NITRITE: NEGATIVE
Protein, ur: NEGATIVE mg/dL
Specific Gravity, Urine: 1.013 (ref 1.005–1.030)
pH: 6 (ref 5.0–8.0)

## 2016-10-31 LAB — URINALYSIS, MICROSCOPIC (REFLEX)

## 2016-10-31 LAB — CBG MONITORING, ED: GLUCOSE-CAPILLARY: 107 mg/dL — AB (ref 65–99)

## 2016-10-31 NOTE — ED Notes (Signed)
Mac & cheese frozen meal given to Pt and a Meatloaf frozen dinner given to Pts husband.
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
## 2016-10-31 NOTE — ED Notes (Signed)
TTS called and door closed with Pt and husband in room

## 2016-10-31 NOTE — ED Notes (Signed)
TTS on monitor speaking to patient.

## 2016-10-31 NOTE — ED Notes (Signed)
Lab notified of new orders for urine.

## 2016-10-31 NOTE — ED Notes (Signed)
Per Gearldine BienenstockBrandy, [facility] pt [facility] not meet inpatient criteria for admission. She will make apt for pt to follow up in the morning at [facility] [place]. She will fax information here to give to pt. Dr. Eudelia Bunchardama and primary nurse Lupe Carneyebekah notified.

## 2016-10-31 NOTE — ED Notes (Signed)
ED Provider at bedside. 
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
## 2016-10-31 NOTE — ED Provider Notes (Addendum)
[facility]Y [facility]PT [facility] Provider Note   CSN: [id number] Arrival date & time: 10/31/16  4098     History   Chief Complaint Chief Complaint  Patient presents with  . Altered Mental Status    HPI Elaine Mills is a 41 y.o. female.  HPI 41 year old female with a history of lupus, bipolar disorder, depression who presents to the emergency department with persistent confusion and "fogginess" since her recent arthroscopy at the end of June. Patient was evaluated by [facility] and admitted at that time with a made medication changes. Upon review of the records patient was switched to Seroquel which seemed to improve her symptomatology. However the patient reports that she has not felt any changes and felt like they just medicated her too much. No real exacerbating or aggravating factors. They deny any recent fevers, illnesses, infections, head trauma. During her admission that [facility], patient obtained a CT head and MRI with and without contrast of the brain which is negative for any acute processes, reviewed records.     Past Medical History:  Diagnosis Date  . Anemia   . Anxiety   . Arthritis   . Asthma   . Bipolar 1 disorder (HCC)   . Borderline personality disorder   . Chronic back pain   . Chronic knee pain   . Fibromyalgia   . Hiatal hernia   . Hypertension   . IBS (irritable bowel syndrome)   . Lupus   . Migraines   . Neck pain   . Obstructive sleep apnea   . Ovarian torsion   . Panic attack   . PTSD (post-traumatic stress disorder)   . Sciatica   . Scoliosis     Patient Active Problem List   Diagnosis Date Noted  . OVARIAN CYST 11/10/2008  . COUGH 11/10/2008  . BIPOLAR DISORDER UNSPECIFIED 11/09/2008  . ASTHMA 11/09/2008  . GASTROESOPHAGEAL REFLUX DISEASE 11/09/2008    Past Surgical History:  Procedure Laterality Date  . BREAST SURGERY    . CHOLECYSTECTOMY    . HERNIA REPAIR    . KNEE SURGERY Left     OB History     No data available       Home Medications    Prior to Admission medications   Medication Sig Start Date End Date Taking? Authorizing Provider  citalopram (CELEXA) 10 MG tablet Take 10 mg by mouth 2 (two) times daily.   Yes [provider]  QUEtiapine (SEROQUEL) 300 MG tablet Take 300 mg by mouth at bedtime.   Yes [provider]  temazepam (RESTORIL) 30 MG capsule Take 30 mg by mouth at bedtime as needed for sleep.   Yes [provider]  venlafaxine (EFFEXOR) 37.5 MG tablet Take 37.5 mg by mouth 2 (two) times daily.   Yes [provider]  albuterol (PROVENTIL HFA;VENTOLIN HFA) 108 (90 BASE) MCG/ACT inhaler Inhale 1-2 puffs into the lungs every 6 (six) hours as needed for wheezing or shortness of breath. 04/03/13   Gerhard Munch, MD  ALPRAZolam (XANAX PO) Take 0.5 mg by mouth 4 (four) times daily as needed.     [provider]  AmLODIPine Besylate (NORVASC PO) Take 5 mg by mouth every morning.     [provider]  cephALEXin (KEFLEX) 500 MG capsule Take 1 capsule (500 mg total) by mouth 4 (four) times daily. 09/20/16   Melene Plan, DO  dicyclomine (BENTYL) 20 MG tablet Take 1 tablet (20 mg total) by mouth 2 (  two) times daily. 10/02/16   Long, Arlyss RepressJoshua G, MD  ergocalciferol (VITAMIN D2) 50000 units capsule Take 50,000 Units by mouth once a week.    [provider]  ferrous sulfate 325 (65 FE) MG tablet Take 325 mg by mouth daily with breakfast.    [provider]  fluticasone (FLONASE) 50 MCG/ACT nasal spray Place 2 sprays into both nostrils daily.     [provider]  fluticasone furoate-vilanterol (BREO ELLIPTA) 100-25 MCG/INH AEPB Inhale 1 puff into the lungs daily.    [provider]  folic acid (FOLVITE) 1 MG tablet Take 1 mg by mouth daily.    [provider]  HYDROCHLOROTHIAZIDE PO Take 25 mg by mouth daily.     [provider]  HYDROcodone-acetaminophen (NORCO/VICODIN) 5-325 MG  tablet Take 1 tablet by mouth every 6 (six) hours as needed for severe pain.  09/19/16   [provider]  hydrocortisone (ANUSOL-HC) 2.5 % rectal cream Place 1 application rectally 2 (two) times daily.    [provider]  ibuprofen (ADVIL,MOTRIN) 800 MG tablet Take 1 tablet (800 mg total) by mouth every 8 (eight) hours as needed. 10/02/16   Long, Arlyss RepressJoshua G, MD  meloxicam (MOBIC) 7.5 MG tablet Take 7.5 mg by mouth daily.    [provider]  metaxalone (SKELAXIN) 800 MG tablet Take 800 mg by mouth 3 (three) times daily as needed for muscle spasms.    [provider]  methotrexate (RHEUMATREX) 2.5 MG tablet Take 15 mg by mouth once a week. Caution:Chemotherapy. Protect from light.     [provider]  Metoclopramide HCl (REGLAN PO) Take 10 mg by mouth 4 (four) times daily.     [provider]  metoprolol succinate (TOPROL-XL) 50 MG 24 hr tablet Take 50 mg by mouth daily. Take with or immediately following a meal.    [provider]  mupirocin ointment (BACTROBAN) 2 % Apply 1 application topically 2 (two) times daily.    [provider]  Omeprazole (PRILOSEC PO) Take 40 mg by mouth daily.     [provider]  OXYCODONE ER PO Take 5 mg by mouth every 4 (four) hours as needed (up to 21 doses).  09/09/16   [provider]  promethazine (PHENERGAN) 25 MG tablet Take 1 tablet (25 mg total) by mouth every 6 (six) hours as needed for nausea or vomiting. 10/02/16   Long, Arlyss RepressJoshua G, MD  rosuvastatin (CRESTOR) 10 MG tablet Take 10 mg by mouth daily.    [provider]  sucralfate (CARAFATE) 1 GM/10ML suspension Take 10 mLs (1 g total) by mouth 4 (four) times daily -  with meals and at bedtime. 10/02/16   Long, Arlyss RepressJoshua G, MD  topiramate (TOPAMAX) 100 MG tablet Take 100 mg by mouth 2 (two) times daily. 09/13/16 10/13/16  [provider]  traZODone (DESYREL) 100 MG tablet Take 200 mg by mouth at bedtime.    [provider]  triamcinolone cream (KENALOG) 0.1 % Apply 1 application topically 2 (two) times daily.    [provider]  Zolpidem Tartrate (AMBIEN PO) Take 15 mg by mouth at bedtime.     [provider]    Family History History reviewed. No pertinent family history.  Social History Social History  Substance Use Topics  . Smoking status: Former Games developermoker  . Smokeless tobacco: Never Used  . Alcohol use Yes     Comment: occ     Allergies   Adhesive [tape];  Clindamycin/lincomycin; and Latex   Review of Systems Review of Systems  All other systems are reviewed and are negative for acute change except as noted in the HPI  Physical Exam Updated Vital Signs BP 107/73   Pulse 95   Temp 98.4 F (36.9 C) (Oral)   Resp (!) 21   Ht 5\' 5"  (1.651 m)   Wt 90.7 kg (200 lb)   LMP 10/01/2016 (Exact Date)   SpO2 100%   BMI 33.28 kg/m   Physical Exam  Constitutional: She is oriented to person, place, and time. She appears well-developed and well-nourished. No distress.  HENT:  Head: Normocephalic and atraumatic.  Nose: Nose normal.  Eyes: Pupils are equal, round, and reactive to light. Conjunctivae and EOM are normal. Right eye exhibits no discharge. Left eye exhibits no discharge. No scleral icterus.  Neck: Normal range of motion. Neck supple.  Cardiovascular: Normal rate and regular rhythm.  Exam reveals no gallop and no friction rub.   No murmur heard. Pulmonary/Chest: Effort normal and breath sounds normal. No stridor. No respiratory distress. She has no rales.  Abdominal: Soft. She exhibits no distension. There is no tenderness.  Musculoskeletal: She exhibits no edema or tenderness.  Neurological: She is alert and oriented to person, place, and time. She has normal strength. No cranial nerve deficit or sensory deficit.  Skin: Skin is warm and dry. No rash noted. She is not diaphoretic. No erythema.  Psychiatric: She has a normal mood and affect. Her speech is  tangential. She is not agitated, not aggressive, not slowed, not actively hallucinating and not combative. Thought content is not paranoid. Cognition and memory are impaired (2/3 word recall). She expresses no homicidal and no suicidal ideation. She exhibits normal recent memory and normal remote memory. She is attentive.  Vitals reviewed.    ED Treatments / Results  Labs (all labs ordered are listed, but only abnormal results are displayed) Labs Reviewed  URINALYSIS, ROUTINE W REFLEX MICROSCOPIC - Abnormal; Notable for the following:       Result Value   Leukocytes, UA SMALL (*)    All other components within normal limits  URINALYSIS, MICROSCOPIC (REFLEX) - Abnormal; Notable for the following:    Bacteria, UA FEW (*)    Squamous Epithelial / LPF 0-5 (*)    All other components within normal limits  COMPREHENSIVE METABOLIC PANEL - Abnormal; Notable for the following:    Total Bilirubin 0.2 (*)    All other components within normal limits  RAPID URINE DRUG SCREEN, HOSP PERFORMED - Abnormal; Notable for the following:    Benzodiazepines POSITIVE (*)    Barbiturates POSITIVE (*)    All other components within normal limits  CBC WITH DIFFERENTIAL/PLATELET - Abnormal; Notable for the following:    WBC 10.7 (*)    MCH 25.0 (*)    RDW 16.6 (*)    Neutro Abs 8.2 (*)    All other components within normal limits  PREGNANCY, URINE  ETHANOL    EKG  EKG Interpretation  Date/Time:  Thursday October 31 2016 95:62:13 EDT Ventricular Rate:  97 PR Interval:    QRS Duration: 81 QT Interval:  335 QTC Calculation: 426 R Axis:   96 Text Interpretation:  Sinus rhythm Low voltage, extremity and precordial leads No significant change since last tracing Confirmed by Drema Pry [id number]) on 10/31/2016 10:26:33 AM Also confirmed by Drema Pry [phone]), editor Madalyn Rob [phone])  on 10/31/2016 11:01:52 AM       Radiology No  results found.  Procedures Procedures (including critical  care time)  Medications Ordered in ED Medications - No data to display   Initial Impression / Assessment and Plan / ED Course  I have reviewed the triage vital signs and the nursing notes.  Pertinent labs & imaging results that were available during my care of the patient were reviewed by me and considered in my medical decision making (see chart for details).     Patient had extensive workup that [facility] which did not reveal any evidence of stroke, lupus cerebritis. This may be hypomanic episode due to the patient's bipolar disorder. Patient already has had medication changes recently.   Had behavioral health evaluate the patient who recommended outpatient management. They recommended [facility] follow-up in the morning. Apparently patient had already gone to [facility] this morning and had an appointment for 2:15 PM. Attempted to discharge the patient with enough time for her to get to her appointment however prior to being discharged the patient now started reporting that she has had suicidal ideations and threatened to overdose with pills several days ago.   I spoke with the husband who cooperated this episode however he did not feel that it was a serious threat. States that he took the medication away from her and has been given her pills as scheduled.   Personally have a concern for secondary gain however I contacted behavioral health again and informed them of the new information. They would like to touch base with the patient's therapist at [facility] to get more collateral information given the patient's history of bipolar and borderline personality.  Behavioral health reevaluated the patient and spoke with the patient's primary therapist who mentioned that this is the patient's baseline behavior. Please see their note for additional history and details.  The also agreed that this is likely secondary gain. Patient does not meet inpatient criteria. Husband was able to contract for  safety.  The patient is safe for discharge with strict return precautions.    Final Clinical Impressions(s) / ED Diagnoses   Final diagnoses:  Confusion  Hypomania (HCC)   Disposition: Discharge  Condition: Good   New Prescriptions   No medications on file    Follow Up: Maye Hides, PA [address] [place] [address] [phone]  Schedule an appointment as soon as possible for a visit    [facility]   For your scheduled appointment at 2:15 PM today      Nira Conn, MD 10/31/16 [phone]

## 2016-10-31 NOTE — ED Notes (Signed)
TTS monitor setup and placed at bedside.

## 2016-10-31 NOTE — BH Assessment (Addendum)
Tele Assessment Note   Elaine Mills is an 41 y.o. female: Pt denies SI/HI and AVH. Pt reports increased anxiety and panic. Pt states that she had a knee surgery in June 2018 and she became disoriented (felt foggy) after the surgery. The Pt states that she also began to experience anxiety and panic attacks. The Pt was admitted to [facility] for anxiety. The Pt was receiving Ambien, Trazodone, and Xanax before being admitted to [facility]. While at [facility] she was prescribed  Effexor and Seroquel. Pt states she does not feel her current medication regimen is effective. The Pt has a scheduled appointment with Barbette Merino in September for medication management. The Pt appeared disoriented and upset during the assessment. The Pt states she would like help with feeling like herself again.  Initial disposition by Feliz Beam, NP was D/C. This Clinical research associate faxed outpatient resources to the Pt. As the Pt was being D/C the Pt reported SI and past SI attempt. Feliz Beam, NP spoke with the Pt's psychiatric NP Barbette Merino with [facility] to obtain collateral information. Feliz Beam, NP obtained collateral information and will complete a tele-psych evaluation. Feliz Beam, NP will determine disposition after tele-psych evaluation.   Diagnosis:  F31.4 Bipolar, severe  Past Medical History:  Past Medical History:  Diagnosis Date  . Anemia   . Anxiety   . Arthritis   . Asthma   . Bipolar 1 disorder (HCC)   . Borderline personality disorder   . Chronic back pain   . Chronic knee pain   . Fibromyalgia   . Hiatal hernia   . Hypertension   . IBS (irritable bowel syndrome)   . Lupus   . Migraines   . Neck pain   . Obstructive sleep apnea   . Ovarian torsion   . Panic attack   . PTSD (post-traumatic stress disorder)   . Sciatica   . Scoliosis     Past Surgical History:  Procedure Laterality Date  . BREAST SURGERY    . CHOLECYSTECTOMY    . HERNIA REPAIR    . KNEE SURGERY Left      Family History: History reviewed. No pertinent family history.  Social History:  reports that she has quit smoking. She has never used smokeless tobacco. She reports that she drinks alcohol. She reports that she does not use drugs.  Additional Social History:  Alcohol / Drug Use Pain Medications: please see mar Prescriptions: please see mar Over the Counter: please see mar History of alcohol / drug use?: No history of alcohol / drug abuse Longest period of sobriety (when/how long): NA  CIWA: CIWA-Ar BP: 120/84 Pulse Rate: 98 COWS:    PATIENT STRENGTHS: (choose at least two) Average or above average intelligence Communication skills  Allergies:  Allergies  Allergen Reactions  . Adhesive [Tape]   . Clindamycin/Lincomycin Rash  . Latex Rash    Home Medications:  (Not in a hospital admission)  OB/GYN Status:  Patient's last menstrual period was 10/01/2016 (exact date).  General Assessment Data Location of Assessment: [facility] Assessment Services TTS Assessment: In system Is this a Tele or Face-to-Face Assessment?: Tele Assessment Is this an Initial Assessment or a Re-assessment for this encounter?: Initial Assessment Marital status: Married [place] name: NA Is patient pregnant?: No Pregnancy Status: No Living Arrangements: Spouse/significant other Can pt return to current living arrangement?: Yes Admission Status: Voluntary Is patient capable of signing voluntary admission?: Yes Referral Source: Self/Family/Friend Insurance type: Medicare     Crisis Care Plan  Living Arrangements: Spouse/significant other Legal Guardian: Other: (self) Name of Psychiatrist: NA Name of Therapist: NA  Education Status Is patient currently in school?: No  Risk to self with the past 6 months Suicidal Ideation: No Has patient been a risk to self within the past 6 months prior to admission? : No Suicidal Intent: No Has patient had any suicidal intent within the past 6 months prior  to admission? : No Is patient at risk for suicide?: No Suicidal Plan?: No Has patient had any suicidal plan within the past 6 months prior to admission? : No Access to Means: No What has been your use of drugs/alcohol within the last 12 months?: NA Previous Attempts/Gestures: No How many times?: 0 Other Self Harm Risks: NA Triggers for Past Attempts: None known Intentional Self Injurious Behavior: None Family Suicide History: No Recent stressful life event(s): Other (Comment) (issues with medications) Persecutory voices/beliefs?: No Depression: Yes Depression Symptoms: Tearfulness, Isolating, Fatigue, Loss of interest in usual pleasures, Feeling worthless/self pity, Feeling angry/irritable Substance abuse history and/or treatment for substance abuse?: No Suicide prevention information given to non-admitted patients: Not applicable  Risk to Others within the past 6 months Homicidal Ideation: No Does patient have any lifetime risk of violence toward others beyond the six months prior to admission? : No Thoughts of Harm to Others: No Current Homicidal Intent: No Current Homicidal Plan: No Access to Homicidal Means: No Identified Victim: NA History of harm to others?: No Assessment of Violence: None Noted Violent Behavior Description: NA Does patient have access to weapons?: No Criminal Charges Pending?: No Does patient have a court date: No Is patient on probation?: No  Psychosis Hallucinations: None noted Delusions: None noted  Mental Status Report Appearance/Hygiene: In hospital gown Eye Contact: Poor Motor Activity: Freedom of movement Speech: Slurred Level of Consciousness: Alert Mood: Depressed Affect: Depressed Anxiety Level: Severe Thought Processes: Flight of Ideas Judgement: Impaired Orientation: Person, Place, Time, Situation Obsessive Compulsive Thoughts/Behaviors: None  Cognitive Functioning Concentration: Decreased Memory: Recent Impaired, Remote  Impaired IQ: Average Insight: Poor Impulse Control: Poor Appetite: Fair Weight Loss: 0 Weight Gain: 0 Sleep: Decreased Total Hours of Sleep: 5 Vegetative Symptoms: None  ADLScreening [facility] Assessment Services) Patient's cognitive ability adequate to safely complete daily activities?: Yes Patient able to express need for assistance with ADLs?: Yes Independently performs ADLs?: Yes (appropriate for developmental age)  Prior Inpatient Therapy Prior Inpatient Therapy: Yes Prior Therapy Dates: 2017 Prior Therapy Facilty/Provider(s): [facility] Reason for Treatment: NA  Prior Outpatient Therapy Prior Outpatient Therapy: No Prior Therapy Dates: NA Prior Therapy Facilty/Provider(s): NA Reason for Treatment: NA Does patient have an ACCT team?: No Does patient have Intensive In-House Services?  : No Does patient have [facility] services? : No Does patient have P4[facility] services?: No  ADL Screening (condition at time of admission) Patient's cognitive ability adequate to safely complete daily activities?: Yes Is the patient deaf or have difficulty hearing?: No Does the patient have difficulty seeing, even when wearing glasses/contacts?: No Patient able to express need for assistance with ADLs?: Yes Does the patient have difficulty dressing or bathing?: No Independently performs ADLs?: Yes (appropriate for developmental age) Weakness of Legs: None Weakness of Arms/Hands: None       Abuse/Neglect Assessment (Assessment to be complete while patient is alone) Physical Abuse: Denies Verbal Abuse: Denies Sexual Abuse: Denies Exploitation of patient/patient's resources: Denies Self-Neglect: Denies     Merchant navy officer (For Healthcare) Does Patient Have a Medical Advance Directive?: No Would patient like information on  creating a medical advance directive?: No - Patient declined    Additional Information 1:1 In Past 12 Months?: No CIRT Risk: No Elopement Risk: No Does patient have  medical clearance?: Yes     Disposition:  Disposition Initial Assessment Completed for this Encounter: Yes  Ezell Melikian D 10/31/2016 1:00 PM

## 2016-10-31 NOTE — ED Triage Notes (Addendum)
Patient anxious upon triage.  States that she has had an altered mental status and she believes this is due to medications.  States that she has "slurred speech".  States this is worse than when she was here 1 month ago.  States she took antibiotics as prescribed.  States she cannot think clearly,has slurred speech and has difficulty comprehending.  Patient alert and oriented at present and speaking in full sentences without difficulty.  Husband states she was at [facility] 2 weeks ago for detoxing from medication she was taking and sleep deprivation.  States they changed her medications and husband states this is not working.  Husband states "she is showing signs of dementia".  Presents with bag of medication.  Attempted to update.  Husband and patient poor historians.  Husband states patient only takes about 4 of the medications which include: xanax PRN, seroquel, ambien, trazodone, effexor.

## 2016-10-31 NOTE — ED Notes (Signed)
Pt calls this rn into room, states "I can hear everyone laughing at me and talking about me. I'm not crazy." pt reassured that no one here is laughing at her and that we are trying to get her the help that she needs.

## 2016-10-31 NOTE — ED Notes (Signed)
Checked patient's HR manually.  Approximately 92 bpm.  MD made aware patient c/o heart racing.

## 2016-10-31 NOTE — ED Notes (Signed)
RN Lupe CarneyRebekah informed that Pt states that her heart is racing.

## 2017-01-12 ENCOUNTER — Emergency Department (HOSPITAL_COMMUNITY)
Admission: EM | Admit: 2017-01-12 | Discharge: 2017-01-12 | Disposition: A | Discharge status: Home / Self Care | Payer: Medicare Other | Attending: Emergency Medicine | Admitting: Emergency Medicine

## 2017-01-12 ENCOUNTER — Encounter (HOSPITAL_COMMUNITY): Payer: Self-pay

## 2017-01-12 ENCOUNTER — Emergency Department (HOSPITAL_COMMUNITY): Payer: Medicare Other

## 2017-01-12 DIAGNOSIS — R410 Disorientation, unspecified: Secondary | ICD-10-CM | POA: Diagnosis not present

## 2017-01-12 DIAGNOSIS — R519 Headache, unspecified: Secondary | ICD-10-CM

## 2017-01-12 DIAGNOSIS — R51 Headache: Secondary | ICD-10-CM | POA: Diagnosis not present

## 2017-01-12 DIAGNOSIS — J45909 Unspecified asthma, uncomplicated: Secondary | ICD-10-CM | POA: Diagnosis not present

## 2017-01-12 DIAGNOSIS — Z91040 Latex allergy status: Secondary | ICD-10-CM | POA: Diagnosis not present

## 2017-01-12 DIAGNOSIS — I10 Essential (primary) hypertension: Secondary | ICD-10-CM | POA: Diagnosis not present

## 2017-01-12 DIAGNOSIS — Z79899 Other long term (current) drug therapy: Secondary | ICD-10-CM | POA: Diagnosis not present

## 2017-01-12 DIAGNOSIS — Z87891 Personal history of nicotine dependence: Secondary | ICD-10-CM | POA: Diagnosis not present

## 2017-01-12 LAB — COMPREHENSIVE METABOLIC PANEL
ALBUMIN: 4 g/dL (ref 3.5–5.0)
ALT: 79 U/L — ABNORMAL HIGH (ref 14–54)
ANION GAP: 8 (ref 5–15)
AST: 40 U/L (ref 15–41)
Alkaline Phosphatase: 79 U/L (ref 38–126)
BUN: 9 mg/dL (ref 6–20)
CHLORIDE: 104 mmol/L (ref 101–111)
CO2: 24 mmol/L (ref 22–32)
Calcium: 9.1 mg/dL (ref 8.9–10.3)
Creatinine, Ser: 0.76 mg/dL (ref 0.44–1.00)
GFR calc non Af Amer: >60 mL/min (ref >60)
GLUCOSE: 100 mg/dL — AB (ref 65–99)
POTASSIUM: 4.2 mmol/L (ref 3.5–5.1)
SODIUM: 136 mmol/L (ref 135–145)
Total Bilirubin: 0.6 mg/dL (ref 0.3–1.2)
Total Protein: 7.8 g/dL (ref 6.5–8.1)

## 2017-01-12 LAB — CBG MONITORING, ED: GLUCOSE-CAPILLARY: 85 mg/dL (ref 65–99)

## 2017-01-12 LAB — RAPID URINE DRUG SCREEN, HOSP PERFORMED
AMPHETAMINES: NOT DETECTED
BARBITURATES: NOT DETECTED
BENZODIAZEPINES: POSITIVE — AB
Cocaine: NOT DETECTED
OPIATES: NOT DETECTED
TETRAHYDROCANNABINOL: NOT DETECTED

## 2017-01-12 LAB — CBC
HCT: 42.5 % (ref 36.0–46.0)
HEMOGLOBIN: 13.9 g/dL (ref 12.0–15.0)
MCH: 26.4 pg (ref 26.0–34.0)
MCHC: 32.7 g/dL (ref 30.0–36.0)
MCV: 80.8 fL (ref 78.0–100.0)
PLATELETS: 307 10*3/uL (ref 150–400)
RBC: 5.26 MIL/uL — AB (ref 3.87–5.11)
RDW: 14.5 % (ref 11.5–15.5)
WBC: 7.4 10*3/uL (ref 4.0–10.5)

## 2017-01-12 LAB — ETHANOL: Alcohol, Ethyl (B): <10 mg/dL (ref <10)

## 2017-01-12 MED ORDER — GADOBENATE DIMEGLUMINE 529 MG/ML IV SOLN
20.0000 mL | Freq: Once | INTRAVENOUS | Status: AC
  Administered 2017-01-12: 20 mL via INTRAVENOUS

## 2017-01-12 MED ORDER — LORAZEPAM 2 MG/ML IJ SOLN
1.0000 mg | Freq: Once | INTRAMUSCULAR | Status: AC
  Administered 2017-01-12: 1 mg via INTRAVENOUS
  Filled 2017-01-12: qty 1

## 2017-01-12 NOTE — BH Assessment (Signed)
Tele Assessment Note   Patient Name: Elaine SimmondsRachael Legendre MRN: [id number] Referring Physician: Ward, PA Location of Patient: [facility] Location of Provider: Behavioral Health TTS Department  Elaine Mills is an 41 y.o. female. Pt denies SI/HI and AVH. Per Pt she is confused, disoriented, and has been suffering from memory loss. Pt reports this has occurred before in August 2018 and June 2018. Pt feels her medication is causing confusion, disorientation, and memory loss. The Pt denies previous SI attempt and self-harming behaviors. Pt was previously hospitalized in June 2018 at [facility] and D/C after medication changes. Pt states she is currently taking Ambien, Trazodone, Xanax, and Seroquel. Pt is seen by Barbette Merinoarolyn McDonald. Per Pt's husband he is concerned about the Pt's medication constantly being changed. Pt denies SA.  Per Dr. Lucianne MussKumar the Pt does not meet inpatient criteria. Recommends follow-up with current psychiatrist to discuss medication management and tapering from benzodiazepines.   Diagnosis:  F31.4 Bipolar; F60.3 Borderline Personality   Past Medical History:  Past Medical History:  Diagnosis Date  . Anemia   . Anxiety   . Arthritis   . Asthma   . Bipolar 1 disorder (HCC)   . Borderline personality disorder (HCC)   . Chronic back pain   . Chronic knee pain   . Fibromyalgia   . Hiatal hernia   . Hypertension   . IBS (irritable bowel syndrome)   . Lupus   . Migraines   . Neck pain   . Obstructive sleep apnea   . Ovarian torsion   . Panic attack   . PTSD (post-traumatic stress disorder)   . Sciatica   . Scoliosis     Past Surgical History:  Procedure Laterality Date  . BREAST SURGERY    . CHOLECYSTECTOMY    . HERNIA REPAIR    . KNEE SURGERY Left     Family History: No family history on file.  Social History:  reports that she has quit smoking. She has never used smokeless tobacco. She reports that she drinks alcohol. She reports that she does not use  drugs.  Additional Social History:  Alcohol / Drug Use Pain Medications: please see mar Prescriptions: please see mar Over the Counter: please see mar History of alcohol / drug use?: No history of alcohol / drug abuse Longest period of sobriety (when/how long): NA  CIWA: CIWA-Ar BP: 117/84 Pulse Rate: (!) 102 COWS:    PATIENT STRENGTHS: (choose at least two) Average or above average intelligence Communication skills  Allergies:  Allergies  Allergen Reactions  . Adhesive [Tape]   . Clindamycin/Lincomycin Rash  . Latex Rash    Home Medications:  (Not in a hospital admission)  OB/GYN Status:  No LMP recorded.  General Assessment Data [facility] Assessment: In system Is this a Tele or Face-to-Face Assessment?: Tele Assessment Is this an Initial Assessment or a Re-assessment for this encounter?: Initial Assessment Marital status: Married DorrisMaiden name: NA Is patient pregnant?: No Pregnancy Status: No Living Arrangements: Spouse/significant other Can pt return to current living arrangement?: Yes Admission Status: Voluntary Is patient capable of signing voluntary admission?: Yes Referral Source: Self/Family/Friend Insurance type: Medicare     Crisis Care Plan Living Arrangements: Spouse/significant other Legal Guardian: Other: (self) Name of Psychiatrist: Barbette Merinoarolyn McDonald Name of Therapist: NA  Education Status Is patient currently in school?: No Current Grade: NA Highest grade of school patient has completed: NA Name of school: NA Contact person: NA  Risk to self with the past  6 months Suicidal Ideation: No Has patient been a risk to self within the past 6 months prior to admission? : No Suicidal Intent: No Has patient had any suicidal intent within the past 6 months prior to admission? : No Is patient at risk for suicide?: No Suicidal Plan?: No Has patient had any suicidal plan within the past 6 months prior to admission? :  No Access to Means: No What has been your use of drugs/alcohol within the last 12 months?: NA Previous Attempts/Gestures: No How many times?: 0 Other Self Harm Risks: NA Triggers for Past Attempts: None known Intentional Self Injurious Behavior: None Family Suicide History: No Recent stressful life event(s): Other (Comment) (medication concerns) Persecutory voices/beliefs?: No Depression: Yes Depression Symptoms: Feeling angry/irritable, Fatigue, Tearfulness Substance abuse history and/or treatment for substance abuse?: No Suicide prevention information given to non-admitted patients: Not applicable  Risk to Others within the past 6 months Homicidal Ideation: No Does patient have any lifetime risk of violence toward others beyond the six months prior to admission? : No Thoughts of Harm to Others: No Current Homicidal Intent: No Current Homicidal Plan: No Access to Homicidal Means: No Identified Victim: NA History of harm to others?: No Assessment of Violence: None Noted Violent Behavior Description: NA Does patient have access to weapons?: No Criminal Charges Pending?: No Does patient have a court date: No Is patient on probation?: No  Psychosis Hallucinations: None noted Delusions: None noted  Mental Status Report Appearance/Hygiene: Unremarkable Eye Contact: Fair Motor Activity: Freedom of movement Speech: Logical/coherent Level of Consciousness: Alert Mood: Anxious Affect: Anxious Anxiety Level: Moderate Thought Processes: Coherent, Relevant Judgement: Unimpaired Orientation: Person, Place, Time, Situation, Appropriate for developmental age Obsessive Compulsive Thoughts/Behaviors: None  Cognitive Functioning Concentration: Normal Memory: Recent Intact, Remote Intact IQ: Average Insight: Fair Impulse Control: Fair Appetite: Fair Weight Loss: 0 Weight Gain: 0 Sleep: Decreased Total Hours of Sleep: 5 Vegetative Symptoms: None  ADLScreening [facility]  Assessment Services) Patient's cognitive ability adequate to safely complete daily activities?: Yes Patient able to express need for assistance with ADLs?: Yes Independently performs ADLs?: Yes (appropriate for developmental age)  Prior Inpatient Therapy Prior Inpatient Therapy: Yes Prior Therapy Dates: 2018 Prior Therapy Facilty/Provider(s): [facility] Reason for Treatment: depression  Prior Outpatient Therapy Prior Outpatient Therapy: Yes Prior Therapy Dates: current Prior Therapy Facilty/Provider(s): Barbette Merino Reason for Treatment: depression Does patient have an ACCT team?: No Does patient have Intensive In-House Services?  : No Does patient have [facility] services? : No Does patient have P4[facility] services?: No  ADL Screening (condition at time of admission) Patient's cognitive ability adequate to safely complete daily activities?: Yes Is the patient deaf or have difficulty hearing?: No Does the patient have difficulty seeing, even when wearing glasses/contacts?: No Does the patient have difficulty concentrating, remembering, or making decisions?: No Patient able to express need for assistance with ADLs?: Yes Does the patient have difficulty dressing or bathing?: No Independently performs ADLs?: Yes (appropriate for developmental age) Does the patient have difficulty walking or climbing stairs?: No Weakness of Legs: None Weakness of Arms/Hands: None       Abuse/Neglect Assessment (Assessment to be complete while patient is alone) Physical Abuse: Denies Verbal Abuse: Denies Sexual Abuse: Denies Exploitation of patient/patient's resources: Denies Self-Neglect: Denies     Merchant navy officer (For Healthcare) Does Patient Have a Medical Advance Directive?: No Would patient like information on creating a medical advance directive?: No - Patient declined    Additional Information 1:1 In Past 12 Months?:  No CIRT Risk: No Elopement Risk: No Does patient have medical  clearance?: Yes     Disposition:  Disposition Initial Assessment Completed for this Encounter: Yes  This service was provided via telemedicine using a 2-way, interactive audio and video technology.  Names of all persons participating in this telemedicine service and their role in this encounter. Name: Mr. Rogness Role: Husband  Name: Dr. Lucianne Muss Role: Medical Director  Name:  Role:   Name:  Role:     Wister Hoefle D 01/12/2017 4:19 PM

## 2017-01-12 NOTE — ED Notes (Signed)
Pt started to freak out, stating that this is not what she is here for.  Pt's husband at bedside reports pt used to take prozac and was recently switched to seroquel when she was d/c from the hospital.  He reports that pt has been paranoid and confused and have been having h/a's.  Pt is A&Ox 4.

## 2017-01-12 NOTE — Discharge Instructions (Signed)
It was my pleasure taking care of you today!   Fortunately, your MRI and lab work were reassuring today.  Our behavioral health team would like you to follow up for further discussion of today's hospital visit.  [facility] in the morning to schedule a follow up appointment.   Return to ER for new or worsening symptoms, any additional concerns.

## 2017-01-12 NOTE — ED Notes (Signed)
PA explained tests results and plan of care to pt. and family .  

## 2017-01-12 NOTE — ED Notes (Signed)
Patient transported to MRI 

## 2017-01-12 NOTE — ED Notes (Signed)
TTS in progress 

## 2017-01-12 NOTE — ED Notes (Signed)
Family at bedside. 

## 2017-01-12 NOTE — ED Provider Notes (Signed)
[facility] EMERGENCY DEPARTMENT Provider Note   CSN: [id number] Arrival date & time: 01/12/17  1216     History   Chief Complaint Chief Complaint  Patient presents with  . headache/confusion    HPI Elaine Mills is a 41 y.o. female.  The history is provided by the patient, the spouse and medical records. No language interpreter was used.   Elaine Mills is a 41 y.o. female  with a PMH of bipolar disorder, lupus, borderline personality disorder, anxiety who presents to the Emergency Department complaining of headaches x 4-5 days along with intermittent confusion since June. Per husband at bedside, patient has been confused during the day for several months. She seems the most confused from around 3pm-8pm during the day, often repeating herself and fixated that she has dementia because she cannot remember things in the short term. Around 8-9pm nightly, confusion will resolve and per husband, she will be talkative, interactive without any confusion. She will then take her nightly medication and go to sleep, awakening each morning seeming more confused than when she went to sleep. No fever, chills, neck pain, visual changes. Denies SI/HI or auditory/visual hallucinations. Husband states that she was admitted to [facility] behavioral health facility where she was started on prozac and one of her other medications was changed. She was doing well and seemed to be improving with little confusion. When she was discharged from facility, they did not send her home on prozac and she started worsening again shortly after being home.   Past Medical History:  Diagnosis Date  . Anemia   . Anxiety   . Arthritis   . Asthma   . Bipolar 1 disorder (HCC)   . Borderline personality disorder (HCC)   . Chronic back pain   . Chronic knee pain   . Fibromyalgia   . Hiatal hernia   . Hypertension   . IBS (irritable bowel syndrome)   . Lupus   . Migraines   . Neck pain   .  Obstructive sleep apnea   . Ovarian torsion   . Panic attack   . PTSD (post-traumatic stress disorder)   . Sciatica   . Scoliosis     Patient Active Problem List   Diagnosis Date Noted  . OVARIAN CYST 11/10/2008  . COUGH 11/10/2008  . BIPOLAR DISORDER UNSPECIFIED 11/09/2008  . ASTHMA 11/09/2008  . GASTROESOPHAGEAL REFLUX DISEASE 11/09/2008    Past Surgical History:  Procedure Laterality Date  . BREAST SURGERY    . CHOLECYSTECTOMY    . HERNIA REPAIR    . KNEE SURGERY Left     OB History    No data available       Home Medications    Prior to Admission medications   Medication Sig Start Date End Date Taking? Authorizing Provider  acetaminophen (TYLENOL) 500 MG tablet Take 500-1,000 mg by mouth every 6 (six) hours as needed for mild pain or headache.   Yes [provider]  albuterol (PROVENTIL HFA;VENTOLIN HFA) 108 (90 BASE) MCG/ACT inhaler Inhale 1-2 puffs into the lungs every 6 (six) hours as needed for wheezing or shortness of breath. 04/03/13  Yes Gerhard Munch, MD  ALPRAZolam Prudy Feeler) 0.5 MG tablet Take 0.5 mg by mouth 2 (two) times daily as needed for anxiety.   Yes [provider]  amLODipine (NORVASC) 5 MG tablet Take 5 mg by mouth daily.   Yes [provider]  diphenhydrAMINE (BENADRYL) 25 MG tablet Take 25 mg by mouth daily  as needed for allergies.   Yes [provider]  ergocalciferol (VITAMIN D2) 50000 units capsule Take 50,000 Units by mouth every Wednesday.    Yes [provider]  ferrous sulfate 325 (65 FE) MG tablet Take 325 mg by mouth daily with breakfast.   Yes [provider]  fluticasone (FLONASE) 50 MCG/ACT nasal spray Place 2 sprays into both nostrils daily as needed for allergies.    Yes [provider]  fluticasone furoate-vilanterol (BREO ELLIPTA) 100-25 MCG/INH AEPB Inhale 1 puff into the lungs daily.   Yes [provider]  hydrochlorothiazide (HYDRODIURIL) 25 MG tablet Take 25  mg by mouth daily.   Yes [provider]  hydrocortisone (ANUSOL-HC) 2.5 % rectal cream Place 1 application rectally 2 (two) times daily as needed for hemorrhoids.    Yes [provider]  ibuprofen (ADVIL,MOTRIN) 800 MG tablet Take 1 tablet (800 mg total) by mouth every 8 (eight) hours as needed. Patient taking differently: Take 800 mg by mouth every 8 (eight) hours as needed (for pain).  10/02/16  Yes Long, Arlyss Repress, MD  metoCLOPramide (REGLAN) 10 MG tablet Take 10 mg by mouth 4 (four) times daily.   Yes [provider]  metoprolol succinate (TOPROL-XL) 50 MG 24 hr tablet Take 50 mg by mouth daily. Take with or immediately following a meal.   Yes [provider]  mupirocin ointment (BACTROBAN) 2 % Apply 1 application topically 2 (two) times daily as needed (for skin infections).    Yes [provider]  omeprazole (PRILOSEC) 40 MG capsule Take 40 mg by mouth daily before breakfast.   Yes [provider]  oxyCODONE-acetaminophen (PERCOCET/ROXICET) 5-325 MG tablet Take 1 tablet by mouth daily.   Yes [provider]  promethazine (PHENERGAN) 25 MG tablet Take 1 tablet (25 mg total) by mouth every 6 (six) hours as needed for nausea or vomiting. 10/02/16  Yes Long, Arlyss Repress, MD  QUEtiapine (SEROQUEL) 25 MG tablet Take 25 mg by mouth 2 (two) times daily.   Yes [provider]  QUEtiapine (SEROQUEL) 300 MG tablet Take 300 mg by mouth at bedtime.   Yes [provider]  risperiDONE (RISPERDAL) 2 MG tablet Take 2 mg by mouth at bedtime. 11/14/16  Yes [provider]  rosuvastatin (CRESTOR) 10 MG tablet Take 10 mg by mouth at bedtime.    Yes [provider]  traZODone (DESYREL) 100 MG tablet Take 100-200 mg by mouth at bedtime.    Yes [provider]  triamcinolone cream (KENALOG) 0.1 % Apply 1 application topically 2 (two) times daily as needed (for skin irritation).    Yes [provider]    zolpidem (AMBIEN) 10 MG tablet Take 15 mg by mouth at bedtime.   Yes [provider]  cephALEXin (KEFLEX) 500 MG capsule Take 1 capsule (500 mg total) by mouth 4 (four) times daily. Patient not taking: Reported on 01/12/2017 09/20/16   Melene Plan, DO  dicyclomine (BENTYL) 20 MG tablet Take 1 tablet (20 mg total) by mouth 2 (two) times daily. Patient not taking: Reported on 01/12/2017 10/02/16   Long, Arlyss Repress, MD  sucralfate (CARAFATE) 1 GM/10ML suspension Take 10 mLs (1 g total) by mouth 4 (four) times daily -  with meals and at bedtime. Patient not taking: Reported on 01/12/2017 10/02/16   Long, Arlyss Repress, MD  Zolpidem Tartrate (AMBIEN PO) Take 15 mg by mouth at bedtime.     [provider]    [facility]  History No family history on file.  Social History Social History  Substance Use Topics  . Smoking status: Former Games developermoker  . Smokeless tobacco: Never Used  . Alcohol use Yes     Comment: occ     Allergies   Celexa [citalopram]; Ziprasidone hcl; Adhesive [tape]; Clindamycin/lincomycin; Lamotrigine; and Latex   Review of Systems Review of Systems  Neurological: Positive for headaches. Negative for seizures, syncope, weakness and numbness.       + confusion  All other systems reviewed and are negative.    Physical Exam Updated Vital Signs BP 116/85 (BP Location: Left Arm)   Pulse 85   Temp 97.9 F (36.6 C) (Oral)   Resp 16   Ht 5\' 5"  (1.651 m)   Wt 93 kg (205 lb)   SpO2 99%   BMI 34.11 kg/m   Physical Exam  Constitutional: She appears well-developed and well-nourished. No distress.  HENT:  Head: Normocephalic and atraumatic.  Cardiovascular: Normal rate, regular rhythm and normal heart sounds.   No murmur heard. Pulmonary/Chest: Effort normal and breath sounds normal. No respiratory distress. She has no wheezes. She has no rales.  Abdominal: Soft. She exhibits no distension. There is no tenderness.  Musculoskeletal: She exhibits no edema.   Neurological:  A&Ox3. Speech clear. CN 2-12 grossly intact. Normal finger-to-nose and rapid alternating movements. No drift. Strength and sensation intact. Steady gait.  Skin: Skin is warm and dry.  Psychiatric:  Anxious appearing.   Nursing note and vitals reviewed.    ED Treatments / Results  Labs (all labs ordered are listed, but only abnormal results are displayed) Labs Reviewed  COMPREHENSIVE METABOLIC PANEL - Abnormal; Notable for the following:       Result Value   Glucose, Bld 100 (*)    ALT 79 (*)    All other components within normal limits  CBC - Abnormal; Notable for the following:    RBC 5.26 (*)    All other components within normal limits  RAPID URINE DRUG SCREEN, HOSP PERFORMED - Abnormal; Notable for the following:    Benzodiazepines POSITIVE (*)    All other components within normal limits  ETHANOL  CBG MONITORING, ED    EKG  EKG Interpretation None       Radiology Mr Laqueta JeanBrain W And Wo Contrast  Result Date: 01/12/2017 CLINICAL DATA:  Irrational behavior. Confusion and memory loss. Headaches for a few months. EXAM: MRI HEAD WITHOUT AND WITH CONTRAST TECHNIQUE: Multiplanar, multiecho pulse sequences of the brain and surrounding structures were obtained without and with intravenous contrast. CONTRAST:  20cc MULTIHANCE IV COMPARISON:  [facility] FINDINGS: Brain: No infarction, hemorrhage, hydrocephalus, extra-axial collection or mass lesion. Normal brain volume and white matter appearance. Vascular: Normal flow voids. Skull and upper cervical spine: Normal marrow signal. Sinuses/Orbits: Negative. IMPRESSION: Normal brain MRI. Electronically Signed   By: Marnee SpringJonathon  Watts M.D.   On: 01/12/2017 18:42    Procedures Procedures (including critical care time)  Medications Ordered in ED Medications  LORazepam (ATIVAN) injection 1 mg (1 mg Intravenous Given 01/12/17 1708)  gadobenate dimeglumine (MULTIHANCE) injection 20 mL (20 mLs  Intravenous Contrast Given 01/12/17 1837)     Initial Impression / Assessment and Plan / ED Course  I have reviewed the triage vital signs and the nursing notes.  Pertinent labs & imaging results that were available during my care of the patient were reviewed by me and considered in my medical decision making (see chart for details).  Elaine Mills is a 41 y.o. female who presents to ED for intermittent confusion, dizziness and headaches for the last 5 months. Hx of bipolar disorder and anxiety on multiple medications. Has been seen in ED back in August for similar which was thought to be medication induced vs. Secondary gain. She has been seen by psychiatry at [facility] multiple times her this as well. She has had negative CT head in the past, but husband states that she has never had MRI and is concerned that someone may have missed physiologic pathology. MRI performed in ED today negative. Labs reassuring. Psychiatry consulted who feels that patient does not meet inpatient criteria and recommends outpatient follow up to taper benzo's. Discussed plan of care with patient and husband at bedside. [facility] information provided. All questions answered.  Patient discussed with Dr. Preston Fleeting who agrees with treatment plan.    Final Clinical Impressions(s) / ED Diagnoses   Final diagnoses:  Bad headache  Confusion    New Prescriptions New Prescriptions   No medications on file     Everlena Mackley, Chase Picket, PA-C 01/12/17 1939    Dione Booze, MD 01/15/17 1430

## 2017-01-12 NOTE — ED Triage Notes (Signed)
Patient complains of headache for several days and ongoing confusion intermittently x 5 months. Spouse reports that she has had her behavioral meds changed a few times and thinks related, patient alert and oriented, denies suicidal thoughts. Tearful on assessment. Patient states I think I have dementia

## 2017-01-22 ENCOUNTER — Emergency Department (HOSPITAL_COMMUNITY): Payer: Medicare Other

## 2017-01-22 ENCOUNTER — Encounter (HOSPITAL_COMMUNITY): Payer: Self-pay

## 2017-01-22 ENCOUNTER — Emergency Department (HOSPITAL_COMMUNITY)
Admission: EM | Admit: 2017-01-22 | Discharge: 2017-01-23 | Disposition: A | Discharge status: Other Acute Inpatient Hospital | Payer: Medicare Other | Attending: Emergency Medicine | Admitting: Emergency Medicine

## 2017-01-22 DIAGNOSIS — Z87891 Personal history of nicotine dependence: Secondary | ICD-10-CM | POA: Insufficient documentation

## 2017-01-22 DIAGNOSIS — M3210 Systemic lupus erythematosus, organ or system involvement unspecified: Secondary | ICD-10-CM | POA: Insufficient documentation

## 2017-01-22 DIAGNOSIS — R410 Disorientation, unspecified: Secondary | ICD-10-CM | POA: Diagnosis not present

## 2017-01-22 DIAGNOSIS — R451 Restlessness and agitation: Secondary | ICD-10-CM | POA: Diagnosis not present

## 2017-01-22 DIAGNOSIS — M797 Fibromyalgia: Secondary | ICD-10-CM | POA: Insufficient documentation

## 2017-01-22 DIAGNOSIS — Z046 Encounter for general psychiatric examination, requested by authority: Secondary | ICD-10-CM | POA: Diagnosis present

## 2017-01-22 DIAGNOSIS — Z91040 Latex allergy status: Secondary | ICD-10-CM | POA: Diagnosis not present

## 2017-01-22 DIAGNOSIS — I10 Essential (primary) hypertension: Secondary | ICD-10-CM | POA: Insufficient documentation

## 2017-01-22 LAB — CBC WITH DIFFERENTIAL/PLATELET
BASOS ABS: 0 10*3/uL (ref 0.0–0.1)
BASOS PCT: 0 %
EOS ABS: 0.1 10*3/uL (ref 0.0–0.7)
Eosinophils Relative: 1 %
HEMATOCRIT: 38.8 % (ref 36.0–46.0)
Hemoglobin: 12.8 g/dL (ref 12.0–15.0)
Lymphocytes Relative: 21 %
Lymphs Abs: 2.1 10*3/uL (ref 0.7–4.0)
MCH: 26.4 pg (ref 26.0–34.0)
MCHC: 33 g/dL (ref 30.0–36.0)
MCV: 80.2 fL (ref 78.0–100.0)
MONO ABS: 0.6 10*3/uL (ref 0.1–1.0)
MONOS PCT: 6 %
NEUTROS ABS: 6.9 10*3/uL (ref 1.7–7.7)
Neutrophils Relative %: 72 %
PLATELETS: 310 10*3/uL (ref 150–400)
RBC: 4.84 MIL/uL (ref 3.87–5.11)
RDW: 14 % (ref 11.5–15.5)
WBC: 9.7 10*3/uL (ref 4.0–10.5)

## 2017-01-22 LAB — ETHANOL

## 2017-01-22 LAB — RAPID URINE DRUG SCREEN, HOSP PERFORMED
Amphetamines: NOT DETECTED
BARBITURATES: NOT DETECTED
BENZODIAZEPINES: POSITIVE — AB
COCAINE: NOT DETECTED
OPIATES: NOT DETECTED
Tetrahydrocannabinol: NOT DETECTED

## 2017-01-22 LAB — COMPREHENSIVE METABOLIC PANEL
ALK PHOS: 82 U/L (ref 38–126)
ALT: 48 U/L (ref 14–54)
ANION GAP: 9 (ref 5–15)
AST: 27 U/L (ref 15–41)
Albumin: 4 g/dL (ref 3.5–5.0)
BUN: 19 mg/dL (ref 6–20)
CALCIUM: 9.1 mg/dL (ref 8.9–10.3)
CHLORIDE: 105 mmol/L (ref 101–111)
CO2: 25 mmol/L (ref 22–32)
CREATININE: 0.82 mg/dL (ref 0.44–1.00)
Glucose, Bld: 111 mg/dL — ABNORMAL HIGH (ref 65–99)
Potassium: 3.5 mmol/L (ref 3.5–5.1)
SODIUM: 139 mmol/L (ref 135–145)
Total Bilirubin: 0.4 mg/dL (ref 0.3–1.2)
Total Protein: 7.6 g/dL (ref 6.5–8.1)

## 2017-01-22 LAB — URINALYSIS, ROUTINE W REFLEX MICROSCOPIC
BACTERIA UA: NONE SEEN
BILIRUBIN URINE: NEGATIVE
Glucose, UA: NEGATIVE mg/dL
Hgb urine dipstick: NEGATIVE
KETONES UR: NEGATIVE mg/dL
LEUKOCYTES UA: NEGATIVE
Nitrite: NEGATIVE
PH: 5 (ref 5.0–8.0)
PROTEIN: 30 mg/dL — AB
Specific Gravity, Urine: 1.031 — ABNORMAL HIGH (ref 1.005–1.030)

## 2017-01-22 LAB — I-STAT BETA HCG BLOOD, ED (MC, WL, AP ONLY): I-stat hCG, quantitative: <5 m[IU]/mL (ref <5)

## 2017-01-22 LAB — TROPONIN I

## 2017-01-22 LAB — SALICYLATE LEVEL

## 2017-01-22 LAB — ACETAMINOPHEN LEVEL

## 2017-01-22 MED ORDER — FLUTICASONE FUROATE-VILANTEROL 100-25 MCG/INH IN AEPB
1.0000 | INHALATION_SPRAY | Freq: Every day | RESPIRATORY_TRACT | Status: DC
  Filled 2017-01-22: qty 28

## 2017-01-22 MED ORDER — ZOLPIDEM TARTRATE 5 MG PO TABS: 15.0000 mg | ORAL_TABLET | Freq: Every day | ORAL | Status: DC

## 2017-01-22 MED ORDER — ACETAMINOPHEN 325 MG PO TABS: 650.0000 mg | ORAL_TABLET | ORAL | Status: DC | PRN

## 2017-01-22 MED ORDER — ALPRAZOLAM 0.5 MG PO TABS
0.5000 mg | ORAL_TABLET | Freq: Two times a day (BID) | ORAL | Status: DC | PRN
  Administered 2017-01-22: 0.5 mg via ORAL
  Filled 2017-01-22: qty 1

## 2017-01-22 MED ORDER — METOCLOPRAMIDE HCL 10 MG PO TABS
10.0000 mg | ORAL_TABLET | Freq: Four times a day (QID) | ORAL | Status: DC
  Administered 2017-01-22 – 2017-01-23 (×2): 10 mg via ORAL
  Filled 2017-01-22 (×2): qty 1

## 2017-01-22 MED ORDER — QUETIAPINE FUMARATE 25 MG PO TABS
25.0000 mg | ORAL_TABLET | Freq: Two times a day (BID) | ORAL | Status: DC
Start: 2017-01-22 — End: 2017-01-23
  Filled 2017-01-22: qty 1

## 2017-01-22 MED ORDER — QUETIAPINE FUMARATE 300 MG PO TABS: 300.0000 mg | ORAL_TABLET | Freq: Every day | ORAL | Status: DC

## 2017-01-22 MED ORDER — ALBUTEROL SULFATE HFA 108 (90 BASE) MCG/ACT IN AERS
1.0000 | INHALATION_SPRAY | Freq: Four times a day (QID) | RESPIRATORY_TRACT | Status: DC | PRN
  Filled 2017-01-22: qty 6.7

## 2017-01-22 MED ORDER — HYDROXYZINE HCL 25 MG PO TABS
25.0000 mg | ORAL_TABLET | Freq: Two times a day (BID) | ORAL | Status: DC
  Administered 2017-01-22 – 2017-01-23 (×2): 25 mg via ORAL
  Filled 2017-01-22 (×2): qty 1

## 2017-01-22 MED ORDER — ALUM & MAG HYDROXIDE-SIMETH 200-200-20 MG/5ML PO SUSP: 30.0000 mL | Freq: Four times a day (QID) | ORAL | Status: DC | PRN

## 2017-01-22 MED ORDER — HYDROCHLOROTHIAZIDE 25 MG PO TABS
25.0000 mg | ORAL_TABLET | Freq: Every day | ORAL | Status: DC
  Filled 2017-01-22: qty 1

## 2017-01-22 MED ORDER — AMLODIPINE BESYLATE 5 MG PO TABS
5.0000 mg | ORAL_TABLET | Freq: Every day | ORAL | Status: DC
  Administered 2017-01-23: 5 mg via ORAL
  Filled 2017-01-22 (×2): qty 1

## 2017-01-22 MED ORDER — QUETIAPINE FUMARATE 300 MG PO TABS
300.0000 mg | ORAL_TABLET | Freq: Every day | ORAL | Status: DC
  Administered 2017-01-22: 300 mg via ORAL
  Filled 2017-01-22: qty 1

## 2017-01-22 MED ORDER — TRAZODONE HCL 100 MG PO TABS
100.0000 mg | ORAL_TABLET | Freq: Every day | ORAL | Status: DC
Start: 2017-01-22 — End: 2017-01-23
  Administered 2017-01-22: 200 mg via ORAL
  Filled 2017-01-22: qty 2

## 2017-01-22 MED ORDER — PANTOPRAZOLE SODIUM 40 MG PO TBEC
40.0000 mg | DELAYED_RELEASE_TABLET | Freq: Every day | ORAL | Status: DC
  Administered 2017-01-23: 40 mg via ORAL
  Filled 2017-01-22: qty 1

## 2017-01-22 MED ORDER — FLUOXETINE HCL 20 MG PO CAPS
20.0000 mg | ORAL_CAPSULE | Freq: Every day | ORAL | Status: DC
  Administered 2017-01-23: 20 mg via ORAL
  Filled 2017-01-22: qty 1

## 2017-01-22 MED ORDER — IBUPROFEN 800 MG PO TABS
800.0000 mg | ORAL_TABLET | Freq: Once | ORAL | Status: AC
  Administered 2017-01-22: 800 mg via ORAL
  Filled 2017-01-22: qty 1

## 2017-01-22 MED ORDER — RISPERIDONE 2 MG PO TABS
2.0000 mg | ORAL_TABLET | Freq: Every day | ORAL | Status: DC
  Administered 2017-01-22: 2 mg via ORAL
  Filled 2017-01-22: qty 1

## 2017-01-22 MED ORDER — ROSUVASTATIN CALCIUM 10 MG PO TABS
10.0000 mg | ORAL_TABLET | Freq: Every day | ORAL | Status: DC
  Administered 2017-01-22: 10 mg via ORAL
  Filled 2017-01-22: qty 1

## 2017-01-22 MED ORDER — LORAZEPAM 1 MG PO TABS
1.0000 mg | ORAL_TABLET | Freq: Once | ORAL | Status: AC
Start: 2017-01-22 — End: 2017-01-22
  Administered 2017-01-22: 1 mg via ORAL
  Filled 2017-01-22: qty 1

## 2017-01-22 MED ORDER — METOPROLOL SUCCINATE ER 50 MG PO TB24
50.0000 mg | ORAL_TABLET | Freq: Every day | ORAL | Status: DC
  Administered 2017-01-23: 50 mg via ORAL
  Filled 2017-01-22: qty 1

## 2017-01-22 MED ORDER — ONDANSETRON HCL 4 MG PO TABS: 4.0000 mg | ORAL_TABLET | Freq: Three times a day (TID) | ORAL | Status: DC | PRN

## 2017-01-22 NOTE — ED Provider Notes (Signed)
Emergency Department Provider Note   I have reviewed the triage vital signs and the nursing notes.   HISTORY  Chief Complaint Panic Attack (IVC)   HPI Elaine Mills is a 41 y.o. female with PMH of Bipolar disorder, fibromyalgia, asthma, and lupus presents to the emergency department for evaluation of increasing anxiety and CP in the setting of taking too many of my meds last night.  Patient was apparently [facility] by her husband after she took too many Ambien and trazodone last night.  Patient states "I just wanted to sleep" and denies any SI/HI.  She reports continued confusion which she describes as symptoms related to medication withdrawal.  She has difficulty telling me when her medications were discontinued.  She denies any auditory or visual hallucinations.  She tells me that "my husband said I was psychotic" but cannot describe further what she means by that.  [facility] and was referred here for further evaluation and medical clearance.   In terms of the patient's chest discomfort she states that has been ongoing for "a while." Cannot give more information regarding radiation of symptoms or modifying factors.   Level 5 caveat: Confusion.   Past Medical History:  Diagnosis Date  . Anemia   . Anxiety   . Arthritis   . Asthma   . Bipolar 1 disorder (HCC)   . Borderline personality disorder (HCC)   . Chronic back pain   . Chronic knee pain   . Fibromyalgia   . Hiatal hernia   . Hypertension   . IBS (irritable bowel syndrome)   . Lupus   . Migraines   . Neck pain   . Obstructive sleep apnea   . Ovarian torsion   . Panic attack   . PTSD (post-traumatic stress disorder)   . Sciatica   . Scoliosis     Patient Active Problem List   Diagnosis Date Noted  . OVARIAN CYST 11/10/2008  . COUGH 11/10/2008  . BIPOLAR DISORDER UNSPECIFIED 11/09/2008  . ASTHMA 11/09/2008  . GASTROESOPHAGEAL REFLUX DISEASE 11/09/2008    Past Surgical  History:  Procedure Laterality Date  . BREAST SURGERY    . CHOLECYSTECTOMY    . HERNIA REPAIR    . KNEE SURGERY Left     Current Outpatient Rx  . Order #: [id number] Class: Historical Med  . Order #: [id number] Class: Print  . Order #: [id number] Class: Historical Med  . Order #: [id number] Class: Historical Med  . Order #: [id number] Class: Historical Med  . Order #: [id number] Class: Historical Med  . Order #: [id number] Class: Historical Med  . Order #: [id number] Class: Historical Med  . Order #: [id number] Class: Historical Med  . Order #: [id number] Class: Historical Med  . Order #: [id number] Class: Historical Med  . Order #: [id number] Class: Historical Med  . Order #: [id number] Class: Historical Med  . Order #: [id number] Class: Print  . Order #: [id number] Class: Historical Med  . Order #: [id number] Class: Historical Med  . Order #: [id number] Class: Historical Med  . Order #: [id number] Class: Historical Med  . Order #: [id number] Class: Historical Med  . Order #: [id number] Class: Historical Med  . Order #: [id number] Class: Historical Med  . Order #: [id number] Class: Historical Med  . Order #: [id number] Class: Historical Med  . Order #: [id number] Class: Historical Med  . Order #: [id number] Class: Historical Med  . Order #: [id number] Class: Historical Med    Allergies Celexa [citalopram]; Ziprasidone hcl; Adhesive [tape]; Clindamycin/lincomycin; Lamotrigine; and Latex  History reviewed. No pertinent family history.  Social  History Social History   Tobacco Use  . Smoking status: Former Games developer  . Smokeless tobacco: Never Used  Substance Use Topics  . Alcohol use: Yes    Comment: occ  . Drug use: No    Review of Systems  Level 5 caveat: Confusion   ____________________________________________   PHYSICAL EXAM:  VITAL SIGNS: Vitals:   01/22/17 2241  BP: 113/83  Pulse: 68  Resp: 18  SpO2: 98%   Constitutional: Alert and anxious appears. Able to provide a scattered, disorganized history.    Eyes: Conjunctivae are normal. PERRL. Head: Atraumatic. Nose: No congestion/rhinnorhea. Mouth/Throat: Mucous membranes are dry.  Neck: No stridor.   Cardiovascular: Normal rate, regular rhythm. Good peripheral circulation. Grossly normal heart sounds.   Respiratory: Normal respiratory effort.  No retractions. Lungs CTAB. Gastrointestinal: Soft and nontender. No distention.  Musculoskeletal: No lower extremity tenderness nor edema. No gross deformities of extremities. Neurologic:  Normal speech and language. No gross focal neurologic deficits are appreciated.  Skin:  Skin is warm, dry and intact. No rash noted. Psychiatric: Mood and affect are blunted. Speech is normal but behavior is evasive and patient appears anxious. Not obviously responding to internal stimulus.   ____________________________________________   LABS (all labs ordered are listed, but only abnormal results are displayed)  Labs Reviewed  ACETAMINOPHEN LEVEL - Abnormal; Notable for the following components:      Result Value   Acetaminophen (Tylenol), Serum <10 (*)    All other components within normal limits  COMPREHENSIVE METABOLIC PANEL - Abnormal; Notable for the following components:   Glucose, Bld 111 (*)    All other components within normal limits  RAPID URINE DRUG SCREEN, HOSP PERFORMED - Abnormal; Notable for the following components:   Benzodiazepines POSITIVE (*)    All other components within normal limits  URINALYSIS, ROUTINE W REFLEX MICROSCOPIC - Abnormal; Notable for the following components:   APPearance HAZY (*)    Specific Gravity, Urine 1.031 (*)    Protein, ur 30 (*)    Squamous Epithelial / LPF 0-5 (*)    All other components within normal limits  ETHANOL  SALICYLATE LEVEL  TROPONIN I  CBC WITH DIFFERENTIAL/PLATELET  I-STAT BETA HCG BLOOD, ED ([facility], [facility], [facility] ONLY)  I-STAT BETA HCG BLOOD, ED ([facility], [facility], [facility] ONLY)   ____________________________________________  RADIOLOGY  Dg Chest 2  View  Result Date: 01/22/2017 CLINICAL DATA:  Chest pain and shortness of breath EXAM: CHEST  2 VIEW COMPARISON:  10/09/2016 FINDINGS: The heart size and mediastinal contours are within normal limits. Both lungs are clear. Degenerative changes of the spine. IMPRESSION: No active cardiopulmonary disease. Electronically Signed   By: Jasmine Pang M.D.   On: 01/22/2017 20:49   ____________________________________________   PROCEDURES  Procedure(s) performed:   Procedures  None ____________________________________________   INITIAL IMPRESSION / ASSESSMENT AND PLAN / ED COURSE  Pertinent labs & imaging results that were available during my care of the patient were reviewed by me and considered in my medical decision making (see chart for details).  Patient presents to the emergency department for evaluation of worsening anxiety with associated chest pain.  She apparently took an unknown quantity of her psychiatry meds in an attempt to "just get some sleep."  According to the patient does have been last night.  She denies any suicidal or homicidal ideation.  Her thought process disorganized at this time.  She is not responding to internal stimulus.  She was seen on 10/28 with confusion and had  an unremarkable workup including brain MRI.  Suspect that this is psychogenic in nature.  I attempted to contact the patient's husband by phone but did not get an answer.  No family at bedside. Plan for clearance labs, CXR, and EKG with complaint of CP but this seems chronic in nature.   09:00 PM The patient's husband and was able to be reached by phone.  He states that the patient has been making a lot of passive suicidal statements about not wanting to live like this anymore.  He reports that she is consistently taking too much medication.  He also felt like he needed to lock up his weapons including knives because of self-harm possibility. He states that she has been increasingly irritable and explosive  at times.   I have completed and filed IVC paperwork. Patient is medically clear for psychiatry evaluation.  ____________________________________________  FINAL CLINICAL IMPRESSION(S) / ED DIAGNOSES  Final diagnoses:  Agitation    MEDICATIONS GIVEN DURING THIS VISIT:  Medications  acetaminophen (TYLENOL) tablet 650 mg (not administered)  ondansetron (ZOFRAN) tablet 4 mg (not administered)  alum & mag hydroxide-simeth (MAALOX/MYLANTA) 200-200-20 MG/5ML suspension 30 mL (not administered)  albuterol (PROVENTIL HFA;VENTOLIN HFA) 108 (90 Base) MCG/ACT inhaler 1-2 puff (not administered)  ALPRAZolam (XANAX) tablet 0.5 mg (0.5 mg Oral Given 01/22/17 2332)  amLODipine (NORVASC) tablet 5 mg (0 mg Oral Hold 01/22/17 2249)  FLUoxetine (PROZAC) capsule 20 mg (0 mg Oral Hold 01/22/17 2248)  fluticasone furoate-vilanterol (BREO ELLIPTA) 100-25 MCG/INH 1 puff (0 puffs Inhalation Hold 01/22/17 2248)  hydrochlorothiazide (HYDRODIURIL) tablet 25 mg (not administered)  hydrOXYzine (ATARAX/VISTARIL) tablet 25 mg (25 mg Oral Given 01/22/17 2251)  metoCLOPramide (REGLAN) tablet 10 mg (10 mg Oral Given 01/22/17 2251)  metoprolol succinate (TOPROL-XL) 24 hr tablet 50 mg (not administered)  pantoprazole (PROTONIX) EC tablet 40 mg (not administered)  QUEtiapine (SEROQUEL) tablet 25 mg (0 mg Oral Hold 01/22/17 2257)  risperiDONE (RISPERDAL) tablet 2 mg (2 mg Oral Given 01/22/17 2250)  rosuvastatin (CRESTOR) tablet 10 mg (10 mg Oral Given 01/22/17 2252)  traZODone (DESYREL) tablet 100-200 mg (200 mg Oral Given 01/22/17 2251)  QUEtiapine (SEROQUEL) tablet 300 mg (300 mg Oral Given 01/22/17 2332)  LORazepam (ATIVAN) tablet 1 mg (1 mg Oral Given 01/22/17 2027)  ibuprofen (ADVIL,MOTRIN) tablet 800 mg (800 mg Oral Given 01/22/17 2252)    Note:  This document was prepared using Dragon voice recognition software and may include unintentional dictation errors.  Alona BeneJoshua Long, MD Emergency Medicine    Long, Arlyss RepressJoshua G,  MD 01/22/17 2350

## 2017-01-22 NOTE — ED Triage Notes (Signed)
[facility]. [facility] at about 2[facility] stating she had taken unknown amounts of Ambien, Trazadone and seroquel. [facility] requests that we "clear patient medically to return [facility]". [facility] shows a positive drug screen for benzodiazepines.

## 2017-01-22 NOTE — ED Notes (Signed)
[facility] Expected date:  Expected time:  Means of arrival:  [facility]

## 2017-01-23 ENCOUNTER — Encounter (HOSPITAL_COMMUNITY): Payer: Self-pay | Admitting: *Deleted

## 2017-01-23 ENCOUNTER — Other Ambulatory Visit: Payer: Self-pay

## 2017-01-23 ENCOUNTER — Inpatient Hospital Stay (HOSPITAL_COMMUNITY)
Admission: EM | Admit: 2017-01-23 | Discharge: 2017-01-29 | DRG: 881 | Disposition: A | Discharge status: Home / Self Care | Payer: Medicare Other | Source: Intra-hospital | Attending: Psychiatry | Admitting: Psychiatry

## 2017-01-23 DIAGNOSIS — G4733 Obstructive sleep apnea (adult) (pediatric): Secondary | ICD-10-CM | POA: Diagnosis present

## 2017-01-23 DIAGNOSIS — T43592A Poisoning by other antipsychotics and neuroleptics, intentional self-harm, initial encounter: Secondary | ICD-10-CM | POA: Diagnosis not present

## 2017-01-23 DIAGNOSIS — Z888 Allergy status to other drugs, medicaments and biological substances status: Secondary | ICD-10-CM

## 2017-01-23 DIAGNOSIS — Z818 Family history of other mental and behavioral disorders: Secondary | ICD-10-CM

## 2017-01-23 DIAGNOSIS — M329 Systemic lupus erythematosus, unspecified: Secondary | ICD-10-CM | POA: Diagnosis present

## 2017-01-23 DIAGNOSIS — F419 Anxiety disorder, unspecified: Secondary | ICD-10-CM | POA: Diagnosis present

## 2017-01-23 DIAGNOSIS — F329 Major depressive disorder, single episode, unspecified: Secondary | ICD-10-CM | POA: Diagnosis present

## 2017-01-23 DIAGNOSIS — K219 Gastro-esophageal reflux disease without esophagitis: Secondary | ICD-10-CM | POA: Diagnosis present

## 2017-01-23 DIAGNOSIS — Z915 Personal history of self-harm: Secondary | ICD-10-CM | POA: Diagnosis not present

## 2017-01-23 DIAGNOSIS — T426X2A Poisoning by other antiepileptic and sedative-hypnotic drugs, intentional self-harm, initial encounter: Secondary | ICD-10-CM | POA: Diagnosis not present

## 2017-01-23 DIAGNOSIS — D649 Anemia, unspecified: Secondary | ICD-10-CM | POA: Diagnosis present

## 2017-01-23 DIAGNOSIS — F3181 Bipolar II disorder: Secondary | ICD-10-CM | POA: Diagnosis not present

## 2017-01-23 DIAGNOSIS — F410 Panic disorder [episodic paroxysmal anxiety] without agoraphobia: Secondary | ICD-10-CM | POA: Diagnosis present

## 2017-01-23 DIAGNOSIS — Z881 Allergy status to other antibiotic agents status: Secondary | ICD-10-CM | POA: Diagnosis not present

## 2017-01-23 DIAGNOSIS — F603 Borderline personality disorder: Secondary | ICD-10-CM | POA: Diagnosis present

## 2017-01-23 DIAGNOSIS — G4700 Insomnia, unspecified: Secondary | ICD-10-CM | POA: Diagnosis present

## 2017-01-23 DIAGNOSIS — F4310 Post-traumatic stress disorder, unspecified: Secondary | ICD-10-CM | POA: Diagnosis present

## 2017-01-23 DIAGNOSIS — Z79899 Other long term (current) drug therapy: Secondary | ICD-10-CM

## 2017-01-23 DIAGNOSIS — Z91040 Latex allergy status: Secondary | ICD-10-CM | POA: Diagnosis not present

## 2017-01-23 DIAGNOSIS — I10 Essential (primary) hypertension: Secondary | ICD-10-CM | POA: Diagnosis present

## 2017-01-23 DIAGNOSIS — K589 Irritable bowel syndrome without diarrhea: Secondary | ICD-10-CM | POA: Diagnosis present

## 2017-01-23 DIAGNOSIS — Z599 Problem related to housing and economic circumstances, unspecified: Secondary | ICD-10-CM | POA: Diagnosis not present

## 2017-01-23 DIAGNOSIS — R451 Restlessness and agitation: Secondary | ICD-10-CM | POA: Diagnosis not present

## 2017-01-23 DIAGNOSIS — F17210 Nicotine dependence, cigarettes, uncomplicated: Secondary | ICD-10-CM | POA: Diagnosis present

## 2017-01-23 DIAGNOSIS — J45909 Unspecified asthma, uncomplicated: Secondary | ICD-10-CM | POA: Diagnosis present

## 2017-01-23 DIAGNOSIS — T43212A Poisoning by selective serotonin and norepinephrine reuptake inhibitors, intentional self-harm, initial encounter: Secondary | ICD-10-CM | POA: Diagnosis not present

## 2017-01-23 DIAGNOSIS — M797 Fibromyalgia: Secondary | ICD-10-CM | POA: Diagnosis present

## 2017-01-23 DIAGNOSIS — I959 Hypotension, unspecified: Secondary | ICD-10-CM | POA: Diagnosis not present

## 2017-01-23 DIAGNOSIS — F314 Bipolar disorder, current episode depressed, severe, without psychotic features: Secondary | ICD-10-CM | POA: Insufficient documentation

## 2017-01-23 DIAGNOSIS — F332 Major depressive disorder, recurrent severe without psychotic features: Secondary | ICD-10-CM | POA: Diagnosis not present

## 2017-01-23 DIAGNOSIS — Z736 Limitation of activities due to disability: Secondary | ICD-10-CM | POA: Diagnosis not present

## 2017-01-23 DIAGNOSIS — T1491XA Suicide attempt, initial encounter: Secondary | ICD-10-CM | POA: Diagnosis not present

## 2017-01-23 MED ORDER — MAGNESIUM HYDROXIDE 400 MG/5ML PO SUSP: 30.0000 mL | Freq: Every day | ORAL | Status: DC | PRN

## 2017-01-23 MED ORDER — ACETAMINOPHEN 325 MG PO TABS
650.0000 mg | ORAL_TABLET | Freq: Four times a day (QID) | ORAL | Status: DC | PRN
  Administered 2017-01-23 – 2017-01-26 (×2): 650 mg via ORAL
  Filled 2017-01-23 (×2): qty 2

## 2017-01-23 MED ORDER — HYDROXYZINE HCL 25 MG PO TABS
25.0000 mg | ORAL_TABLET | Freq: Three times a day (TID) | ORAL | Status: DC
  Administered 2017-01-23 – 2017-01-24 (×4): 25 mg via ORAL
  Filled 2017-01-23 (×7): qty 1

## 2017-01-23 MED ORDER — HYDROXYZINE HCL 25 MG PO TABS
25.0000 mg | ORAL_TABLET | Freq: Two times a day (BID) | ORAL | Status: DC
  Filled 2017-01-23 (×5): qty 1

## 2017-01-23 MED ORDER — HYDROCHLOROTHIAZIDE 25 MG PO TABS
25.0000 mg | ORAL_TABLET | Freq: Every day | ORAL | Status: DC
  Administered 2017-01-24: 25 mg via ORAL
  Filled 2017-01-23 (×5): qty 1

## 2017-01-23 MED ORDER — QUETIAPINE FUMARATE 300 MG PO TABS
300.0000 mg | ORAL_TABLET | Freq: Every day | ORAL | Status: DC
  Administered 2017-01-23: 300 mg via ORAL
  Filled 2017-01-23 (×2): qty 1

## 2017-01-23 MED ORDER — NICOTINE 21 MG/24HR TD PT24
21.0000 mg | MEDICATED_PATCH | Freq: Every day | TRANSDERMAL | Status: DC
  Administered 2017-01-23 – 2017-01-29 (×6): 21 mg via TRANSDERMAL
  Filled 2017-01-23 (×10): qty 1

## 2017-01-23 MED ORDER — FLUTICASONE FUROATE-VILANTEROL 100-25 MCG/INH IN AEPB
1.0000 | INHALATION_SPRAY | Freq: Every day | RESPIRATORY_TRACT | Status: DC
  Administered 2017-01-24 – 2017-01-29 (×6): 1 via RESPIRATORY_TRACT
  Filled 2017-01-23: qty 28

## 2017-01-23 MED ORDER — TRAZODONE HCL 100 MG PO TABS
200.0000 mg | ORAL_TABLET | Freq: Once | ORAL | Status: AC
  Administered 2017-01-23: 200 mg via ORAL

## 2017-01-23 MED ORDER — PANTOPRAZOLE SODIUM 40 MG PO TBEC
40.0000 mg | DELAYED_RELEASE_TABLET | Freq: Every day | ORAL | Status: DC
  Administered 2017-01-24 – 2017-01-29 (×6): 40 mg via ORAL
  Filled 2017-01-23 (×9): qty 1

## 2017-01-23 MED ORDER — QUETIAPINE FUMARATE 25 MG PO TABS
25.0000 mg | ORAL_TABLET | Freq: Two times a day (BID) | ORAL | Status: DC
  Administered 2017-01-23 – 2017-01-24 (×2): 25 mg via ORAL
  Filled 2017-01-23 (×4): qty 1

## 2017-01-23 MED ORDER — AMLODIPINE BESYLATE 5 MG PO TABS
5.0000 mg | ORAL_TABLET | Freq: Every day | ORAL | Status: DC
  Administered 2017-01-24 – 2017-01-26 (×2): 5 mg via ORAL
  Filled 2017-01-23 (×6): qty 1

## 2017-01-23 MED ORDER — FLUOXETINE HCL 20 MG PO CAPS
20.0000 mg | ORAL_CAPSULE | Freq: Every day | ORAL | Status: DC
  Administered 2017-01-24: 20 mg via ORAL
  Filled 2017-01-23 (×3): qty 1

## 2017-01-23 MED ORDER — ONDANSETRON HCL 4 MG PO TABS
4.0000 mg | ORAL_TABLET | Freq: Three times a day (TID) | ORAL | Status: DC | PRN
  Administered 2017-01-27 – 2017-01-29 (×2): 4 mg via ORAL
  Filled 2017-01-23 (×2): qty 1

## 2017-01-23 MED ORDER — TRAZODONE HCL 100 MG PO TABS
100.0000 mg | ORAL_TABLET | Freq: Every day | ORAL | Status: DC
  Filled 2017-01-23: qty 2

## 2017-01-23 MED ORDER — RISPERIDONE 2 MG PO TABS
2.0000 mg | ORAL_TABLET | Freq: Every day | ORAL | Status: DC
  Filled 2017-01-23 (×2): qty 1

## 2017-01-23 MED ORDER — ZOLPIDEM TARTRATE 5 MG PO TABS
5.0000 mg | ORAL_TABLET | Freq: Every evening | ORAL | Status: AC | PRN
  Administered 2017-01-23: 5 mg via ORAL
  Filled 2017-01-23: qty 1

## 2017-01-23 MED ORDER — METOPROLOL SUCCINATE ER 50 MG PO TB24
50.0000 mg | ORAL_TABLET | Freq: Every day | ORAL | Status: DC
  Administered 2017-01-24 – 2017-01-29 (×4): 50 mg via ORAL
  Filled 2017-01-23 (×9): qty 1

## 2017-01-23 MED ORDER — ALUM & MAG HYDROXIDE-SIMETH 200-200-20 MG/5ML PO SUSP: 30.0000 mL | ORAL | Status: DC | PRN

## 2017-01-23 MED ORDER — ALBUTEROL SULFATE HFA 108 (90 BASE) MCG/ACT IN AERS
1.0000 | INHALATION_SPRAY | Freq: Four times a day (QID) | RESPIRATORY_TRACT | Status: DC | PRN
  Administered 2017-01-25 – 2017-01-29 (×5): 2 via RESPIRATORY_TRACT
  Filled 2017-01-23: qty 6.7

## 2017-01-23 MED ORDER — TRAZODONE HCL 50 MG PO TABS
50.0000 mg | ORAL_TABLET | Freq: Every evening | ORAL | Status: DC | PRN
  Administered 2017-01-23: 50 mg via ORAL

## 2017-01-23 MED ORDER — METOCLOPRAMIDE HCL 10 MG PO TABS
10.0000 mg | ORAL_TABLET | Freq: Four times a day (QID) | ORAL | Status: DC
  Administered 2017-01-23 – 2017-01-29 (×23): 10 mg via ORAL
  Filled 2017-01-23 (×34): qty 1

## 2017-01-23 MED ORDER — ROSUVASTATIN CALCIUM 10 MG PO TABS
10.0000 mg | ORAL_TABLET | Freq: Every day | ORAL | Status: DC
  Administered 2017-01-23 – 2017-01-28 (×6): 10 mg via ORAL
  Filled 2017-01-23 (×8): qty 1

## 2017-01-23 NOTE — ED Notes (Signed)
CONTACTED POISON CONTROL AND SPOKE WITH KRISTE. STATES PT  MEDICALLY CLEARED FROM POISON CONTROL.

## 2017-01-23 NOTE — Progress Notes (Signed)
Patient ID: Elaine Mills, female   DOB: 1975/11/15, 41 y.o.   MRN: [id number] PER STATE REGULATIONS 482.30  THIS CHART WAS REVIEWED FOR MEDICAL NECESSITY WITH RESPECT TO THE PATIENT'S ADMISSION/DURATION OF STAY.  NEXT REVIEW DATE: 01/27/17  Loura HaltBARBARA Martasia Talamante, RN, BSN CASE MANAGER

## 2017-01-23 NOTE — BH Assessment (Addendum)
Assessment Note  Elaine Mills is an 41 y.o. female who presents to the emergency department under IVC for further evaluation and medical clearance .  Per pt's chart pt apparently taken to [facility] by her husband after she took too many Ambien and trazodone last night.  Patient states "I just wanted to sleep" and denies any SI/HI.  She reports continued confusion. Pt denies any auditory or visual hallucinations   Patient was wearing scrubs and appeared appropriately groomed.  Pt was somnolent throughout the assessment.  Patient made no eye contact and kept her eyes closed and had no psychomotor activity.  Patient spoke in a soft voice with slurred speech.  UTA pt insight and judgement due to pt's somnolent state.  Pt did not appear to be responding to internal stimuli  Disposition: LPCA discussed case with [facility] provider, Donell Sievert, PA who recommends inpatient treatment.   LPCA followed up with [facility], Tori, RN about suitable bed availability at [facility]. Per [facility] patient is accepted into [facility] (Room 407 bed 02) pending poison control clearance.  W[facility] provider, Antony Madura, PA and pt's nurse Hansel Starling, RN informed of the PA's recommendation and AC's request.  Diagnosis: Major Depressive disorder; Bipolar 1 Disorer  Past Medical History:  Past Medical History:  Diagnosis Date  . Anemia   . Anxiety   . Arthritis   . Asthma   . Bipolar 1 disorder (HCC)   . Borderline personality disorder (HCC)   . Chronic back pain   . Chronic knee pain   . Fibromyalgia   . Hiatal hernia   . Hypertension   . IBS (irritable bowel syndrome)   . Lupus   . Migraines   . Neck pain   . Obstructive sleep apnea   . Ovarian torsion   . Panic attack   . PTSD (post-traumatic stress disorder)   . Sciatica   . Scoliosis     Past Surgical History:  Procedure Laterality Date  . BREAST SURGERY    . CHOLECYSTECTOMY    . HERNIA REPAIR    . KNEE SURGERY Left     Family History: History reviewed. No pertinent  family history.  Social History:  reports that she has quit smoking. she has never used smokeless tobacco. She reports that she drinks alcohol. She reports that she does not use drugs.  Additional Social History:  Alcohol / Drug Use Pain Medications: See MARs Prescriptions: See MARs Over the Counter: See MARs History of alcohol / drug use?: Yes Longest period of sobriety (when/how long): unknown Substance #1 Name of Substance 1: Marijuana 1 - Age of First Use: 41 y/o 1 - Amount (size/oz): unknown 1 - Frequency: unknown 1 - Duration: unknown 1 - Last Use / Amount: Pt denies currently using marijuana but does not know the last time she used Substance #2 Name of Substance 2: Cocaine 2 - Age of First Use: 41 y/o 2 - Amount (size/oz): unknown 2 - Frequency: unknown 2 - Duration: unknown 2 - Last Use / Amount: unknown pt denies currently using cocaine  CIWA: CIWA-Ar BP: 113/83 Pulse Rate: 68 COWS:    Allergies:  Allergies  Allergen Reactions  . Celexa [Citalopram] Anaphylaxis  . Ziprasidone Hcl Nausea And Vomiting  . Adhesive [Tape] Rash  . Clindamycin/Lincomycin Rash  . Lamotrigine Rash  . Latex Rash    No powdered gloves!!    Home Medications:  (Not in a hospital admission)  OB/GYN Status:  Patient's last menstrual period was 01/05/2017 (exact date).  General Assessment Data [facility] of [facility]: [facility] TTS Assessment: In system Is this a Tele or Face-to-Face Assessment?: Face-to-Face Is this an Initial Assessment or a Re-assessment for this encounter?: Initial Assessment Marital status: Married CrabtreeMaiden name: N/A Is patient pregnant?: No Pregnancy Status: No Living Arrangements: Spouse/significant other, Parent(Pt reports living with both parents and her husband) Can pt return to current living arrangement?: Yes Admission Status: Involuntary Is patient capable of signing voluntary admission?: Yes Referral Source: Other(Pt [facility]  clearance) Insurance type: Medicare     Crisis Care Plan Living Arrangements: Spouse/significant other, Parent(Pt reports living with both parents and her husband) Legal Guardian: Other:(Self) Name of Psychiatrist: Barbette Merinoarolyn McDonald Name of Therapist: NA  Education Status Is patient currently in school?: No Current Grade: n/a Highest grade of school patient has completed: NA Name of school: NA Contact person: NA  Risk to self with the past 6 months Suicidal Ideation: No Has patient been a risk to self within the past 6 months prior to admission? : No Suicidal Intent: No Has patient had any suicidal intent within the past 6 months prior to admission? : No Is patient at risk for suicide?: No Suicidal Plan?: No Has patient had any suicidal plan within the past 6 months prior to admission? : No Access to Means: No What has been your use of drugs/alcohol within the last 12 months?: n/a Previous Attempts/Gestures: No How many times?: 0 Other Self Harm Risks: n/a Triggers for Past Attempts: None known Intentional Self Injurious Behavior: None Family Suicide History: No Recent stressful life event(s): Other (Comment)(Medication concerns) Persecutory voices/beliefs?: No Depression: Yes Depression Symptoms: Fatigue, Tearfulness, Loss of interest in usual pleasures, Feeling angry/irritable Substance abuse history and/or treatment for substance abuse?: Yes Suicide prevention information given to non-admitted patients: Not applicable  Risk to Others within the past 6 months Homicidal Ideation: No Does patient have any lifetime risk of violence toward others beyond the six months prior to admission? : No Thoughts of Harm to Others: No Current Homicidal Intent: No Current Homicidal Plan: No Access to Homicidal Means: No History of harm to others?: No Assessment of Violence: None Noted Violent Behavior Description: n/a Does patient have access to weapons?: No Criminal Charges  Pending?: No Does patient have a court date: No Is patient on probation?: No  Psychosis Hallucinations: None noted Delusions: None noted  Mental Status Report Appearance/Hygiene: Unremarkable Eye Contact: Unable to Assess(Pt was barely awake during assessment) Motor Activity: Freedom of movement Speech: Soft, Slurred, Logical/coherent Level of Consciousness: Drowsy, Sedated Mood: (UTA) Affect: Unable to Assess Anxiety Level: None(UTA) Thought Processes: Coherent, Relevant Judgement: Unable to Assess Orientation: Person, Situation, Appropriate for developmental age Obsessive Compulsive Thoughts/Behaviors: Unable to Assess  Cognitive Functioning Concentration: Unable to Assess Memory: Unable to Assess IQ: Average Insight: Unable to Assess Impulse Control: Unable to Assess Appetite: Poor Sleep: Decreased(Pt report going 5 days without sleeping PTA) Vegetative Symptoms: Staying in bed  ADLScreening [facility] Assessment Services) Patient's cognitive ability adequate to safely complete daily activities?: Yes Patient able to express need for assistance with ADLs?: Yes Independently performs ADLs?: Yes (appropriate for developmental age)  Prior Inpatient Therapy Prior Inpatient Therapy: Yes Prior Therapy Dates: 2018 Prior Therapy Facilty/Provider(s): [facility] Reason for Treatment: depression  Prior Outpatient Therapy Prior Outpatient Therapy: Yes Prior Therapy Dates: current Prior Therapy Facilty/Provider(s): Barbette Merinoarolyn McDonald Reason for Treatment: depression Does patient have an ACCT team?: No Does patient have Intensive In-House Services?  : No Does patient have  [facility] services? : Yes Does patient have P4CC services?: No  ADL Screening (condition at time of admission) Patient's cognitive ability adequate to safely complete daily activities?: Yes Is the patient deaf or have difficulty hearing?: No Does the patient have difficulty seeing, even when wearing  glasses/contacts?: No Does the patient have difficulty concentrating, remembering, or making decisions?: No Patient able to express need for assistance with ADLs?: Yes Does the patient have difficulty dressing or bathing?: No Independently performs ADLs?: Yes (appropriate for developmental age) Does the patient have difficulty walking or climbing stairs?: No Weakness of Legs: None Weakness of Arms/Hands: None  Home Assistive Devices/Equipment Home Assistive Devices/Equipment: None    Abuse/Neglect Assessment (Assessment to be complete while patient is alone) Abuse/Neglect Assessment Can Be Completed: Yes Physical Abuse: Denies Verbal Abuse: Denies Sexual Abuse: Denies Exploitation of patient/patient's resources: Denies Self-Neglect: Denies     Merchant navy officerAdvance Directives (For Healthcare) Does Patient Have a Medical Advance Directive?: No Would patient like information on creating a medical advance directive?: No - Patient declined    Additional Information 1:1 In Past 12 Months?: No CIRT Risk: No Elopement Risk: No Does patient have medical clearance?: Yes     Disposition: LPCA [facility], Donell SievertSpencer Simon, PA who recommends inpatient treatment.   LPCA [facility], Tori, RN about [facility]. [facility] pending poison control clearance.  [facility] provider, Antony MaduraKelly Humes, PA and pt's nurse Hansel StarlingLatricia London, RN informed of the PA's recommendation and AC's request.   Disposition Initial Assessment Completed for this Encounter: Yes Disposition of Patient: Inpatient treatment program Type of inpatient treatment program: Adult Other disposition(s): Other (Comment)(Per AC, Tori pt needs to be cleared by poison control)  On Site Evaluation by:  Stepan Verrette L. Kymere Fullington, MS, LPCA, NCC Reviewed with Physician:    Maziah Smola L Coy Vandoren, MS, LPCA, NCC 01/23/2017 2:27 AM

## 2017-01-23 NOTE — Progress Notes (Signed)
Elaine BlanksRachael is a 41 year old female admitted on involuntary basis. On admission, Elaine Mills is very tearful and reports there is something going on with her brain and she does not know what is wrong with her. She denied any SI and stated that last night she only took one extra trazodone and reported that her husband did bring her to the hospital because she was feeling groggy. She reports that she has multiple medical issues that she needs help with. She reports feeling confused, disoriented, not able to complete simple tasks and report chronic pain in back and knee and reports surgery in June to left knee and reports she feel on knee recently but does not remember when. On admission there is some bruising and swelling present to her left knee. She reports she has a PCP and takes her medications as prescribed. She reports that she lives with her husband and will go back there at discharge. Elaine Mills was oriented to the unit and safety maintained.

## 2017-01-23 NOTE — ED Notes (Signed)
Pt discharged safely with [employer].  All belongings were sent with patient.  Pharmacy medications were returned and sent with belongings.    Pt was in no distress at discharge.
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
## 2017-01-23 NOTE — BH Assessment (Signed)
[facility]  Per Antony MaduraKelly Humes, PA , this pt requires psychiatric hospitalization at this time.  Berneice Heinrichina Tate, RN, [id number]; they are now ready to receive pt.  Pt presents under [facility], and upheld by EDP Alona BeneJoshua Long, MD, and [facility] documents [facility].  Pt's nurse, Kendal Hymendie, has been notified, and agrees to send original paperwork along with pt via [facility] [place], and to call report to [phone].  Doylene Canninghomas Adarius Tigges, MA Triage Specialist [phone]
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
## 2017-01-23 NOTE — ED Notes (Signed)
Bed: WBH37 Expected date:  Expected time:  Means of arrival:  Comments: 

## 2017-01-23 NOTE — ED Notes (Signed)
[facility] pt [facility], presents with anxiety and anxiousness.  A&O x 3, adamantly requesting sleeping meds.  Monitoring for safety, Q 15 min checks in effect.  Pt ingested unknown amt of Ambien, Trazodone and Seroquel. Safety check for contraband completed, no items found.

## 2017-01-23 NOTE — ED Provider Notes (Signed)
7:05 AM [facility]  She has been medically cleared by prior physician as well as by poison control.  Disposition set to "transfer".   Antony MaduraHumes, Donnielle Addison, PA-C 01/23/17 [id number]    Palumbo, April, MD 01/23/17 [phone]

## 2017-01-23 NOTE — BHH Counselor (Signed)
  LPCA spoke to [facility] charge nurse, Arlys JohnBrian, RN concerning the triage note stating,  [facility] that we "clear patient medically [facility]  Per Charge Nurse, [facility] will not be [facility].  LPCA will follow up with TTS consult and complete BH Assessment as requested.  Myndi Wamble L. Preston Garabedian, MS, LPCA, [facility] Therapeutic Triage Specialist  [phone]

## 2017-01-24 DIAGNOSIS — T43212A Poisoning by selective serotonin and norepinephrine reuptake inhibitors, intentional self-harm, initial encounter: Secondary | ICD-10-CM

## 2017-01-24 DIAGNOSIS — Z599 Problem related to housing and economic circumstances, unspecified: Secondary | ICD-10-CM

## 2017-01-24 DIAGNOSIS — Z736 Limitation of activities due to disability: Secondary | ICD-10-CM

## 2017-01-24 DIAGNOSIS — F419 Anxiety disorder, unspecified: Secondary | ICD-10-CM

## 2017-01-24 DIAGNOSIS — F603 Borderline personality disorder: Secondary | ICD-10-CM

## 2017-01-24 DIAGNOSIS — F329 Major depressive disorder, single episode, unspecified: Secondary | ICD-10-CM | POA: Diagnosis present

## 2017-01-24 DIAGNOSIS — T426X2A Poisoning by other antiepileptic and sedative-hypnotic drugs, intentional self-harm, initial encounter: Secondary | ICD-10-CM

## 2017-01-24 DIAGNOSIS — T43592A Poisoning by other antipsychotics and neuroleptics, intentional self-harm, initial encounter: Secondary | ICD-10-CM

## 2017-01-24 DIAGNOSIS — G4700 Insomnia, unspecified: Secondary | ICD-10-CM

## 2017-01-24 DIAGNOSIS — F17210 Nicotine dependence, cigarettes, uncomplicated: Secondary | ICD-10-CM

## 2017-01-24 DIAGNOSIS — F332 Major depressive disorder, recurrent severe without psychotic features: Secondary | ICD-10-CM

## 2017-01-24 MED ORDER — FLUOXETINE HCL 20 MG PO CAPS
40.0000 mg | ORAL_CAPSULE | Freq: Every day | ORAL | Status: DC
  Administered 2017-01-25 – 2017-01-28 (×4): 40 mg via ORAL
  Filled 2017-01-24 (×5): qty 2

## 2017-01-24 MED ORDER — HYDROXYZINE HCL 25 MG PO TABS
25.0000 mg | ORAL_TABLET | Freq: Three times a day (TID) | ORAL | Status: DC | PRN
  Administered 2017-01-26 – 2017-01-28 (×2): 25 mg via ORAL
  Filled 2017-01-24 (×3): qty 1

## 2017-01-24 MED ORDER — TRAZODONE HCL 100 MG PO TABS
200.0000 mg | ORAL_TABLET | Freq: Every day | ORAL | Status: DC
  Administered 2017-01-24 – 2017-01-26 (×2): 200 mg via ORAL
  Filled 2017-01-24 (×4): qty 2

## 2017-01-24 MED ORDER — QUETIAPINE FUMARATE 400 MG PO TABS
400.0000 mg | ORAL_TABLET | Freq: Every day | ORAL | Status: DC
  Administered 2017-01-24: 400 mg via ORAL
  Filled 2017-01-24 (×3): qty 1

## 2017-01-24 MED ORDER — ZOLPIDEM TARTRATE 5 MG PO TABS
5.0000 mg | ORAL_TABLET | Freq: Every evening | ORAL | Status: DC | PRN
  Administered 2017-01-24 – 2017-01-27 (×4): 5 mg via ORAL
  Filled 2017-01-24 (×4): qty 1

## 2017-01-24 MED ORDER — TRAZODONE HCL 150 MG PO TABS
400.0000 mg | ORAL_TABLET | Freq: Once | ORAL | Status: DC
  Filled 2017-01-24: qty 1

## 2017-01-24 NOTE — [facility] Group Notes (Signed)
LCSW Group Therapy Note  01/24/2017 1:15pm  Type of Therapy and Topic:  Group Therapy:  Feelings around Relapse and Recovery  Participation Level: Pt invited. Did not attend.   Elaine JordanLynn B Stella Mills, MSW, LCSWA 01/24/2017 2:08 PM

## 2017-01-24 NOTE — [facility] Suicide Risk Assessment (Signed)
[facility] INPATIENT:  Family/Significant Other Suicide Prevention Education  Suicide Prevention Education:  Patient Refusal for Family/Significant Other Suicide Prevention Education: The patient Elaine Mills has refused to provide written consent for family/significant other to be provided Family/Significant Other Suicide Prevention Education during admission and/or prior to discharge.  Physician notified.  Elaine Mills, MSW, LCSWA  01/24/2017, 2:07 PM

## 2017-01-24 NOTE — Social Work (Signed)
Referred to [facility], is [place] resident.  Anne Cunningham, LCSW Lead Clinical Social Worker Phone:  [phone]  
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
## 2017-01-24 NOTE — Progress Notes (Signed)
Recreation Therapy Notes  Date: 01/24/17 Time: 0930 Location: 300 Hall Group Room  Group Topic: Stress Management  Goal Area(s) Addresses:  Patient will verbalize importance of using healthy stress management.  Patient will identify positive emotions associated with healthy stress management.   Behavioral Response: Engaged  Intervention: Stress Management  Activity :  Progressive Muscle Relaxation.  LRT introduced the stress management technique of progressive muscle relaxation.  LRT lead the group the exercise of tensing and relaxing each muscle group individually.  Education:  Stress Management, Discharge Planning.   Education Outcome: Acknowledges edcuation/In group clarification offered/Needs additional education  Clinical Observations/Feedback: Pt attended group.   Caroll RancherMarjette Arvis Zwahlen, LRT/CTRS         Lillia AbedLindsay, Ellissa Ayo A 01/24/2017 11:40 AM

## 2017-01-24 NOTE — Progress Notes (Signed)
D: Pt presents with flat affect and depressed mood. Denies SI, HI, AVH and pain. Pt has been proccuppied with getting her Ambien "I was taking my Ambien for 14 years and now they stopped it, I'm having withdrawing symptoms, I'm vomiting all night and I was awake too, no sleep". Pt's speech is repetitive, confused at times on interactions.  Pt could not show staff any emesis when asked. She denied overdosing on Seroquel, Trazodone or Ambien on assessment. Per pt "I only took one extra Seroquel "I took 600 mg instead of 300 mg, that all". A: Medications administered as prescribed with verbal educations and effects monitored. Emotional support and availability provided to pt throughout this shift. Pt encouraged to voice concerns and comply with treatment regimen including groups. Q 15 minutes safety checks maintained without self harm gestures thus far this shift. R: Pt receptive to care. Compliant with medications when offered. Denies adverse drug reactions. Pt attended groups and participated. Tolerates all PO intake well without emesis this shift. Remains safe on and off unit.

## 2017-01-24 NOTE — Progress Notes (Signed)
Nursing Progress Note: 7p-7a D: Pt currently presents with a anxious/concrete/repitive affect and behavior. Pt states "I want my ambien. Please ask the doctor. I can't sleep without it. I want it. He said no? Can you get it then? You know can you give it to me. I can't sleep without it." Interacting minimally with the milieu. Pt reports "terrible" sleep during the previous night with current medication regimen. Pt did attend wrap-up group.  A: Pt provided with medications per providers orders. Pt's labs and vitals were monitored throughout the night. Pt supported emotionally and encouraged to express concerns and questions. Pt educated on medications.  R: Pt's safety ensured with 15 minute and environmental checks. Pt currently denies SI, HI, and AVH. Pt verbally contracts to seek staff if SI,HI, or AVH occurs and to consult with staff before acting on any harmful thoughts. Will continue to monitor.
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
## 2017-01-24 NOTE — [facility] Suicide Risk Assessment (Signed)
[facility]   Nursing information obtained from:    Demographic factors:    Current Mental Status:    Loss Factors:    Historical Factors:    Risk Reduction Factors:     Total Time spent with patient: 30 minutes Principal Problem: <principal problem not specified> Diagnosis:   Patient Active Problem List   Diagnosis Date Noted  . Bipolar 2 disorder (HCC) [F31.81] 01/23/2017  . Bipolar 1 disorder, depressed, severe (HCC) [F31.4] 01/23/2017  . OVARIAN CYST [N83.209] 11/10/2008  . COUGH [R05] 11/10/2008  . BIPOLAR DISORDER UNSPECIFIED [F31.9] 11/09/2008  . ASTHMA [J45.909] 11/09/2008  . GASTROESOPHAGEAL REFLUX DISEASE [K21.9] 11/09/2008   Subjective Data:  41 y.o Caucasian female, married, lives in her parents home with her husband. Background history of Bipolar disorder and Borderline PD. Presented to the ER via EMS. Patient was referred by her outpatient providers. She had presented to them with marked insomnia and anxiety. Had taken extra doses of Ambien, Trazodone and Seroquel. Denied any intent to die "just wanted to sleep". Was reported to be slurred and somnolent at presentation. Had marked psychomotor retardation but not responding to internal stimuli. Routine labs were essentially normal.  UDS was positive for benzodiazepines. No alcohol. Denies any recent overdose. No final acts, no goodbye messages. Denies any thoughts of suicide. Overwhelmed by psychsocial stressors. Marked autonomic hyperactivity associated with insomnia. No evidence of psychosis or mania. No violent or homicidal thoughts. We have adjusted her medications. Patient is cooperative with care.   Continued Clinical Symptoms:  Alcohol Use Disorder Identification Test Final Score (AUDIT): 1 The "Alcohol Use Disorders Identification Test", Guidelines for Use in Primary Care, Second Edition.  World Science writerHealth Organization [facility](WHO). Score between 0-7:  no or low risk or alcohol related  problems. Score between 8-15:  moderate risk of alcohol related problems. Score between 16-19:  high risk of alcohol related problems. Score 20 or above:  warrants further diagnostic evaluation for alcohol dependence and treatment.   CLINICAL FACTORS:   Depression:   Severe   Musculoskeletal: Strength & Muscle Tone: within normal limits Gait & Station: normal Patient leans: N/A  Psychiatric Specialty Exam: Physical Exam  ROS  Blood pressure 109/74, pulse 90, temperature 98.5 F (36.9 C), temperature source Oral, resp. rate 20, height 5\' 4"  (1.626 m), weight 91.6 kg (202 lb), last menstrual period 01/05/2017.Body mass index is 34.67 kg/m.  General Appearance: As in H&P  Eye Contact:    Speech:    Volume:    Mood:    Affect:    Thought Process:  As in H&P  Orientation:    Thought Content:    Suicidal Thoughts:    Homicidal Thoughts:    Memory:  As in H&P  Judgement:    Insight:    Psychomotor Activity:    Concentration:    Recall:    Fund of Knowledge:    Language:    Akathisia:    Handed:  Right  AIMS (if indicated):     Assets:  As in H&P  ADL's:    Cognition:    Sleep:  Number of Hours: 4.5      COGNITIVE FEATURES THAT CONTRIBUTE TO RISK:  None    SUICIDE RISK:   Minimal: No identifiable suicidal ideation.  Patients presenting with no risk factors but with morbid ruminations; may be classified as minimal risk based on the severity of the depressive symptoms  PLAN OF CARE:  As in H&P  I certify that  inpatient services furnished can reasonably be expected to improve the patient's condition.   Georgiann CockerVincent A Izediuno, MD 01/24/2017, 1:14 PM
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
## 2017-01-25 DIAGNOSIS — T1491XA Suicide attempt, initial encounter: Secondary | ICD-10-CM

## 2017-01-25 MED ORDER — QUETIAPINE FUMARATE 400 MG PO TABS
500.0000 mg | ORAL_TABLET | Freq: Every day | ORAL | Status: DC
  Administered 2017-01-25 – 2017-01-27 (×3): 500 mg via ORAL
  Filled 2017-01-25 (×5): qty 1

## 2017-01-25 NOTE — Progress Notes (Signed)
Pt attended evening wrap up group and stated that her day was a 1-2 she dealt with stressors from her sons's location and not having placement once discharged.

## 2017-01-25 NOTE — [facility] Group Notes (Signed)
[facility] LCSW Group Therapy Note  Date/Time:    01/25/2017   10:00 - 11:00 AM  Type of Therapy and Topic:  Group Therapy:  Healthy Self Image and Positive Change  Participation Level:  Minimal   Description of Group:  In this group, patients compared and contrasted their current "I am...." statements to the visions they identified as desirable for their lives.  Patients discussed their tendency toward cognitive distortions, and how they can go about making positive changes in their cognitions that will positively impact their behaviors.  Many expressions of similarities and mutual support were provided among group members.  Facilitator played a motivational 3-minute speech and a discussion was held regarding reactions.  Patients were left with the task of thinking about what "I am...." statements they can start using in their lives immediately.  Therapeutic Goals: 1. Patient will state their current self-perception as expressed in an "I Am" statement 2. Patient will contrast this with their desired vision for their lives 3. Patient will discuss cognitive distortions and how these affect their ongoing "I Am" thoughts 4. Patient will verbalize statements that challenge their cognitive distortions  Summary of Patient Progress:  The patient expressed initially "I am anxious", then by the end of group was able to state "I am working on being healthier" and explain why she was able to make this change.  She left for a large portion of group, then returned toward the end.   Therapeutic Modalities Cognitive Behavioral Therapy Motivational Interviewing  Ambrose MantleMareida Grossman-Orr, LCSW 01/25/2017 12:13 PM

## 2017-01-25 NOTE — Progress Notes (Signed)
D: Pt A & O X4. Denies SI, HI, AVH and pain. Presents with depressed affect and mood. Remains preoccupied about her medications, especially her Ambien which she wants to be increased. Per pt "I can't sleep but I feel dizzy, my blood pressure is low, I rather have the doctor go up on my Ambien, that's what I took at home". Pt continues to be needy and somatic in her interactions. A: Medications administered per MD's orders with verbal education and effects monitored. All Antihypertensives held this shift due to low BP reading and fluids encouraged. Emotional support and reassurance provided to pt. Medications orders reviewed with pt but to no avail as she continues to demand more medications especially her Ambien. Encouraged pt to change positions slowly as well due to orthostatic hypotension, c/o dizziness, comply with treatment regimen. Safety checks remains at Q 15 minutes intervals without self harm gestures for outburst. R: Pt receptive to care. Compliant with medications when offered. Denies adverse drug reactions. Pt attended group this shift and was engaged. POC continues for safety and mood stability.

## 2017-01-25 NOTE — Progress Notes (Signed)
Pt attended wrap up group and stated that her day was a 6-7 after having played in the gym and visiting with her husband. She stated that she was unable to sleep the previous night and had her medication adjusted. Pt will be monitored throughout the night.

## 2017-01-25 NOTE — Progress Notes (Signed)
[facility] MD Progress Note  01/25/2017 2:43 PM Elaine Mills  MRN:  [id number]   Subjective:  Patient reports that she did not sleep well and she tossed and turned all night. She states that she got up this morning and her BP was low and she was dizzy. She is requesting an increase in her Ambien or Seroquel. However, when dicussed increasing the Seroquel, patient states that it doesn't help.  Objective: Patient's chart and findings reviewed and discussed with treatment team. Patient appears flat and depressed. Patient is somatic. Patient is focused more on having her Ambien increased. She is educated and a lengthy discussion about medication and then she reports that she may need to change medications. Patient has a history of significant borderline traits and has presented several today. She has stopped me in the hallway 5 separate times today after visiting with her and all focused on Ambien, except for the last time and it was to say that maybe changing her medications would be better.   Principal Problem: MDD (major depressive disorder) Diagnosis:   Patient Active Problem List   Diagnosis Date Noted  . MDD (major depressive disorder) [F32.9] 01/24/2017  . Borderline personality disorder (HCC) [F60.3] 01/24/2017  . Bipolar 2 disorder (HCC) [F31.81] 01/23/2017  . Bipolar 1 disorder, depressed, severe (HCC) [F31.4] 01/23/2017  . OVARIAN CYST [N83.209] 11/10/2008  . COUGH [R05] 11/10/2008  . BIPOLAR DISORDER UNSPECIFIED [F31.9] 11/09/2008  . ASTHMA [J45.909] 11/09/2008  . GASTROESOPHAGEAL REFLUX DISEASE [K21.9] 11/09/2008   Total Time spent with patient: 25 minutes  Past Psychiatric History: See H&P  Past Medical History:  Past Medical History:  Diagnosis Date  . Anemia   . Anxiety   . Arthritis   . Asthma   . Bipolar 1 disorder (HCC)   . Borderline personality disorder (HCC)   . Chronic back pain   . Chronic knee pain   . Fibromyalgia   . Hiatal hernia   . Hypertension   . IBS  (irritable bowel syndrome)   . Lupus   . Migraines   . Neck pain   . Obstructive sleep apnea   . Ovarian torsion   . Panic attack   . PTSD (post-traumatic stress disorder)   . Sciatica   . Scoliosis     Past Surgical History:  Procedure Laterality Date  . BREAST SURGERY    . CHOLECYSTECTOMY    . HERNIA REPAIR    . KNEE SURGERY Left    Family History: History reviewed. No pertinent family history. Family Psychiatric  History: See H&P Social History:  Social History   Substance and Sexual Activity  Alcohol Use Yes   Comment: occ     Social History   Substance and Sexual Activity  Drug Use No    Social History   Socioeconomic History  . Marital status: Married    Spouse name: None  . Number of children: None  . Years of education: None  . Highest education level: None  Social Needs  . Financial resource strain: None  . Food insecurity - worry: None  . Food insecurity - inability: None  . Transportation needs - medical: None  . Transportation needs - non-medical: None  Occupational History  . None  Tobacco Use  . Smoking status: Current Every Day Smoker    Packs/day: 0.50  . Smokeless tobacco: Never Used  Substance and Sexual Activity  . Alcohol use: Yes    Comment: occ  . Drug use: No  . Sexual activity:  Not Currently    Birth control/protection: None  Other Topics Concern  . None  Social History Narrative  . None   Additional Social History:      Sleep: Fair  Appetite:  Good  Current Medications: Current Facility-Administered Medications  Medication Dose Route Frequency Provider Last Rate Last Dose  . acetaminophen (TYLENOL) tablet 650 mg  650 mg Oral Q6H PRN Laveda AbbeParks, Laurie Britton, NP   650 mg at 01/23/17 2203  . albuterol (PROVENTIL HFA;VENTOLIN HFA) 108 (90 Base) MCG/ACT inhaler 1-2 puff  1-2 puff Inhalation Q6H PRN Laveda AbbeParks, Laurie Britton, NP      . alum & mag hydroxide-simeth (MAALOX/MYLANTA) 200-200-20 MG/5ML suspension 30 mL  30 mL Oral Q4H  PRN Laveda AbbeParks, Laurie Britton, NP      . amLODipine (NORVASC) tablet 5 mg  5 mg Oral Daily Laveda AbbeParks, Laurie Britton, NP   5 mg at 01/24/17 0758  . FLUoxetine (PROZAC) capsule 40 mg  40 mg Oral Daily Izediuno, Delight OvensVincent A, MD   40 mg at 01/25/17 0831  . fluticasone furoate-vilanterol (BREO ELLIPTA) 100-25 MCG/INH 1 puff  1 puff Inhalation Daily Laveda AbbeParks, Laurie Britton, NP   1 puff at 01/25/17 [id number]  . hydrochlorothiazide (HYDRODIURIL) tablet 25 mg  25 mg Oral Daily Laveda AbbeParks, Laurie Britton, NP   25 mg at 01/24/17 0758  . hydrOXYzine (ATARAX/VISTARIL) tablet 25 mg  25 mg Oral TID PRN Izediuno, Delight OvensVincent A, MD      . magnesium hydroxide (MILK OF MAGNESIA) suspension 30 mL  30 mL Oral Daily PRN Kerry HoughSimon, Spencer E, PA-C      . metoCLOPramide (REGLAN) tablet 10 mg  10 mg Oral QID Laveda AbbeParks, Laurie Britton, NP   10 mg at 01/25/17 1213  . metoprolol succinate (TOPROL-XL) 24 hr tablet 50 mg  50 mg Oral Daily Laveda AbbeParks, Laurie Britton, NP   50 mg at 01/24/17 0759  . nicotine (NICODERM CQ - dosed in mg/24 hours) patch 21 mg  21 mg Transdermal Daily Cobos, Rockey SituFernando A, MD   21 mg at 01/25/17 0839  . ondansetron (ZOFRAN) tablet 4 mg  4 mg Oral Q8H PRN Laveda AbbeParks, Laurie Britton, NP      . pantoprazole (PROTONIX) EC tablet 40 mg  40 mg Oral Daily Laveda AbbeParks, Laurie Britton, NP   40 mg at 01/25/17 [id number]  . QUEtiapine (SEROQUEL) tablet 500 mg  500 mg Oral QHS Money, Gerlene Burdockravis B, FNP      . rosuvastatin (CRESTOR) tablet 10 mg  10 mg Oral QHS Laveda AbbeParks, Laurie Britton, NP   10 mg at 01/24/17 2202  . traZODone (DESYREL) tablet 200 mg  200 mg Oral QHS Izediuno, Vincent A, MD   200 mg at 01/24/17 2202  . zolpidem (AMBIEN) tablet 5 mg  5 mg Oral QHS PRN Jackelyn PolingBerry, Jason A, NP   5 mg at 01/24/17 2204    Lab Results: No results found for this or any previous visit (from the past 48 hour(s)).  Blood Alcohol level:  Lab Results  Component Value Date   ETH <10 01/22/2017   ETH <10 01/12/2017    Metabolic Disorder Labs: No results found for: HGBA1C, MPG No results  found for: PROLACTIN No results found for: CHOL, TRIG, HDL, CHOLHDL, VLDL, LDLCALC  Physical Findings: AIMS: Facial and Oral Movements Muscles of Facial Expression: None, normal Lips and Perioral Area: None, normal Jaw: None, normal Tongue: None, normal,Extremity Movements Upper (arms, wrists, hands, fingers): None, normal Lower (legs, knees, ankles, toes): None, normal, Trunk Movements Neck, shoulders, hips: None,  normal, Overall Severity Severity of abnormal movements (highest score from questions above): None, normal Incapacitation due to abnormal movements: None, normal Patient's awareness of abnormal movements (rate only patient's report): No Awareness, Dental Status Current problems with teeth and/or dentures?: No Does patient usually wear dentures?: No  CIWA:    COWS:     Musculoskeletal: Strength & Muscle Tone: within normal limits Gait & Station: normal Patient leans: N/A  Psychiatric Specialty Exam: Physical Exam  Nursing note and vitals reviewed. Constitutional: She is oriented to person, place, and time. She appears well-developed and well-nourished.  Cardiovascular: Normal rate.  Respiratory: Effort normal.  Musculoskeletal: Normal range of motion.  Neurological: She is alert and oriented to person, place, and time.  Skin: Skin is warm.    Review of Systems  Constitutional: Negative.   HENT: Negative.   Eyes: Negative.   Respiratory: Negative.   Cardiovascular: Negative.   Gastrointestinal: Negative.   Genitourinary: Negative.   Musculoskeletal: Negative.   Skin: Negative.   Neurological: Negative.   Endo/Heme/Allergies: Negative.   Psychiatric/Behavioral: Positive for depression. Negative for hallucinations and suicidal ideas. The patient is nervous/anxious.     Blood pressure 100/64, pulse 75, temperature 98.7 F (37.1 C), temperature source Oral, resp. rate 14, height 5\' 4"  (1.626 m), weight 91.6 kg (202 lb), last menstrual period 01/05/2017.Body mass  index is 34.67 kg/m.  General Appearance: Disheveled  Eye Contact:  Good  Speech:  Clear and Coherent and Normal Rate  Volume:  Decreased  Mood:  Depressed  Affect:  Depressed  Thought Process:  Goal Directed and Descriptions of Associations: Intact  Orientation:  Full (Time, Place, and Person)  Thought Content:  WDL  Suicidal Thoughts:  No  Homicidal Thoughts:  No  Memory:  Immediate;   Good Recent;   Good Remote;   Good  Judgement:  Fair  Insight:  Fair  Psychomotor Activity:  Normal  Concentration:  Concentration: Good and Attention Span: Good  Recall:  Good  Fund of Knowledge:  Good  Language:  Fair  Akathisia:  No  Handed:  Right  AIMS (if indicated):     Assets:  Communication Skills Desire for Improvement Financial Resources/Insurance Housing Physical Health Social Support Transportation  ADL's:  Intact  Cognition:  WNL  Sleep:  Number of Hours: 4.5   Problems Addressed:  MDD Severe Borderline Personality Disorder  Treatment Plan Summary: Daily contact with patient to assess and evaluate symptoms and progress in treatment, Medication management and Plan is to:  -Increase Seroquel 500 mg PO QHS for mood stability -Continue Ambien 5 mg PO QHS for insomnia -Continue Prozac 40 mg PO Daily for mood stability -Continue Vistaril 25 mg PO TID PRN for anxiety -Continue Trazodone 200 mg PO QHS for insomnia -Encourage group therapy participation   Maryfrances Bunnellravis B Money, FNP 01/25/2017, 2:43 PM

## 2017-01-25 NOTE — Progress Notes (Signed)
Nursing Progress Note: 7p-7a D: Pt currently presents with a depressed/anxious/concrete thinking/blocking affect and behavior. Pt states "I really need to get more ambien. That's all I want. No one wants to get my meds right." Interacting minimally with the milieu. Pt reports "terrible" sleep during the previous night with current medication regimen. Pt did attend wrap-up group.  A: Pt provided with medications per providers orders. Pt's labs and vitals were monitored throughout the night. Pt supported emotionally and encouraged to express concerns and questions. Pt educated on medications.  R: Pt's safety ensured with 15 minute and environmental checks. Pt currently denies SI, HI, and AVH. Pt verbally contracts to seek staff if SI,HI, or AVH occurs and to consult with staff before acting on any harmful thoughts. Will continue to monitor.

## 2017-01-25 NOTE — Progress Notes (Signed)
D: Pt only focus was on her Ambien. Pt took her Ambien , Trazodone, Seroquel and pt asked 30 minutes later for her medications, pt was informed that she already took her medications. Pt presents with no insight at this time.   A: Pt was offered support and encouragement. Pt was given scheduled medications. Pt was encourage to attend groups. Q 15 minute checks were done for safety.   R:Pt attends groups and interacts well with peers . Pt is taking medication .Pt receptive to treatment and safety maintained on unit.

## 2017-01-25 NOTE — Plan of Care (Signed)
Pt safe on the unit at this time 

## 2017-01-26 NOTE — [facility] Group Notes (Signed)
[facility]  Date/Time:  01/26/2017 10:00-11:00AM  Type of Therapy and Topic:  Group Therapy:  Healthy and Unhealthy Supports  Participation Level:  Active   Description of Group:  Patients in this group were introduced to the idea of adding a variety of healthy supports to address the various needs in their lives. The picture on the front of Sunday's workbook was used to demonstrate why more supports are needed in every patient's life.  Patients identified and described healthy supports versus unhealthy supports in general, then gave examples of each in their own lives.   They discussed what additional healthy supports could be helpful in their recovery and wellness after discharge in order to prevent future hospitalizations.   An emphasis was placed on using counselor, doctor, therapy groups, 12-step groups, and problem-specific support groups to expand supports.  They also worked as a group on developing a specific plan for several patients to deal with unhealthy supports through boundary-setting, psychoeducation with loved ones, and even termination of relationships.   Therapeutic Goals:   1)  discuss importance of adding supports to stay well once out of the hospital  2)  compare healthy versus unhealthy supports and identify some examples of each  3)  generate ideas and descriptions of healthy supports that can be added  4)  offer mutual support about how to address unhealthy supports  5)  encourage active participation in and adherence to discharge plan    Summary of Patient Progress:  The patient shared that the current healthy supports available in her life are nonexistent, while the current unhealthy supports are her family and her husband at times.  The patient expressed a willingness to add therapy to help in her recovery journey.   Therapeutic Modalities:   Motivational Interviewing Brief Solution-Focused Therapy  Ambrose MantleMareida Grossman-Orr, LCSW 01/26/2017,  11:00AM

## 2017-01-26 NOTE — Progress Notes (Signed)
Adult Psychoeducational Group Note  Date:  01/26/2017 Time:  11:20 PM  Group Topic/Focus:  Wrap-Up Group:   The focus of this group is to help patients review their daily goal of treatment and discuss progress on daily workbooks.  Participation Level:  Active  Participation Quality:  Attentive  Affect:  Flat  Cognitive:  Alert  Insight: Good  Engagement in Group:  Engaged  Modes of Intervention:  Discussion, Education and Support  Additional Comments:  Pt rated her day at a 5/10 due to things going on with her family. Pt reported her goal was for the MD to change her status from involuntary to voluntary. Pt reported participating more during activities throughout the day although she was not feeling well.  Malachy MoanJeffers, Sumiya Mamaril S 01/26/2017, 11:20 PM

## 2017-01-26 NOTE — Progress Notes (Signed)
Data. Patient denies SI/HI/AVH. Verbally contracts for safety on the unit and to come to staff before acting of any self harm thoughts/feelings.  Patient wanders around the unit seemingly aimlessly. She does not participate in unit milieu and frequently will not even go in the day room. Her affect is always flat/worried and she appears frantic at times. Affect does not brighten, ever. She speaks in a monotone and her conversation is perseverative on her living situation, her parents leaving the house and how, "They are making me leave tomorrow, even when I don't feel well." Patient also reports, "I feel tired and anxious all the time."  Orlene ErmConversation is often tangential as well. Patient also reports, "My feet are swelling," but refused to take her HCTZ this AM. Patient was educated about the correlation between these two facts and encouraged to take it in the morning and put her feet up for the rest of the afternoon. Patient does not appear to understand information provided, as she looks blank throughout the conversation. On her self assessment she reports 0/10 for depression and hopelessness and 4/10 for anxiety. Her goal for today is, "Being voluntary." Action. Emotional support and encouragement offered. Education provided on medication, indications and side effect. Q 15 minute checks done for safety. Response. Safety on the unit maintained through 15 minute checks.

## 2017-01-26 NOTE — Progress Notes (Signed)
[facility]  01/26/2017 12:45 PM Conley SimmondsRachael Sebring   MRN:  [id number]   Subjective:  Patient reports that she was told she would have her status changed and she would be considered voluntary and no one has told her yet. She was informed. She then is still concerned with lack of energy, depressed, foggy headed, and difficulty with sleep.   Objective: Patient's chart and findings reviewed and discussed with treatment team. Patient appears flat and depressed. She has been in the day room but has not been interacting. Patient was focused on her IVC until she was informed that Dr. Jackquline BerlinIzediuno has changed her to voluntary status. She is still concerned about medications and what is best. This has been discussed with Dr. Jackquline BerlinIzediuno and the decision was made to keep her on the same medications at this time. Medications discussed were using Abilify, Thorazine, or Restoril, and stopping the Ambien, Trazodone, and/or decreasing the Seroquel. Suspect patient may be on too many sedating medications, but patient has stated she needs a neurologist.   Principal Problem: MDD (major depressive disorder) Diagnosis:   Patient Active Problem List   Diagnosis Date Noted  . MDD (major depressive disorder) [F32.9] 01/24/2017  . Borderline personality disorder (HCC) [F60.3] 01/24/2017  . Bipolar 2 disorder (HCC) [F31.81] 01/23/2017  . Bipolar 1 disorder, depressed, severe (HCC) [F31.4] 01/23/2017  . OVARIAN CYST [N83.209] 11/10/2008  . COUGH [R05] 11/10/2008  . BIPOLAR DISORDER UNSPECIFIED [F31.9] 11/09/2008  . ASTHMA [J45.909] 11/09/2008  . GASTROESOPHAGEAL REFLUX DISEASE [K21.9] 11/09/2008   Total Time spent with patient: 15 minutes  Past Psychiatric History: See H&P  Past Medical History:  Past Medical History:  Diagnosis Date  . Anemia   . Anxiety   . Arthritis   . Asthma   . Bipolar 1 disorder (HCC)   . Borderline personality disorder (HCC)   . Chronic back pain   . Chronic knee pain   .  Fibromyalgia   . Hiatal hernia   . Hypertension   . IBS (irritable bowel syndrome)   . Lupus   . Migraines   . Neck pain   . Obstructive sleep apnea   . Ovarian torsion   . Panic attack   . PTSD (post-traumatic stress disorder)   . Sciatica   . Scoliosis     Past Surgical History:  Procedure Laterality Date  . BREAST SURGERY    . CHOLECYSTECTOMY    . HERNIA REPAIR    . KNEE SURGERY Left    Family History: History reviewed. No pertinent family history. Family Psychiatric  History: See H&P Social History:  Social History   Substance and Sexual Activity  Alcohol Use Yes   Comment: occ     Social History   Substance and Sexual Activity  Drug Use No    Social History   Socioeconomic History  . Marital status: Married    Spouse name: None  . Number of children: None  . Years of education: None  . Highest education level: None  Social Needs  . Financial resource strain: None  . Food insecurity - worry: None  . Food insecurity - inability: None  . Transportation needs - medical: None  . Transportation needs - non-medical: None  Occupational History  . None  Tobacco Use  . Smoking status: Current Every Day Smoker    Packs/day: 0.50  . Smokeless tobacco: Never Used  Substance and Sexual Activity  . Alcohol use: Yes    Comment: occ  . Drug  use: No  . Sexual activity: Not Currently    Birth control/protection: None  Other Topics Concern  . None  Social History Narrative  . None   Additional Social History:                         Sleep: 6 hours documented but patient states disrupted through the night  Appetite:  Good  Current Medications: Current Facility-Administered Medications  Medication Dose Route Frequency Provider Last Rate Last Dose  . acetaminophen (TYLENOL) tablet 650 mg  650 mg Oral Q6H PRN Laveda Abbe, NP   650 mg at 01/26/17 0749  . albuterol (PROVENTIL HFA;VENTOLIN HFA) 108 (90 Base) MCG/ACT inhaler 1-2 puff  1-2 puff  Inhalation Q6H PRN Laveda Abbe, NP   2 puff at 01/25/17 1711  . alum & mag hydroxide-simeth (MAALOX/MYLANTA) 200-200-20 MG/5ML suspension 30 mL  30 mL Oral Q4H PRN Laveda Abbe, NP      . amLODipine (NORVASC) tablet 5 mg  5 mg Oral Daily Laveda Abbe, NP   5 mg at 01/26/17 0800  . FLUoxetine (PROZAC) capsule 40 mg  40 mg Oral Daily Izediuno, Delight Ovens, MD   40 mg at 01/26/17 0751  . fluticasone furoate-vilanterol (BREO ELLIPTA) 100-25 MCG/INH 1 puff  1 puff Inhalation Daily Laveda Abbe, NP   1 puff at 01/26/17 0748  . hydrochlorothiazide (HYDRODIURIL) tablet 25 mg  25 mg Oral Daily Laveda Abbe, NP   25 mg at 01/24/17 0758  . hydrOXYzine (ATARAX/VISTARIL) tablet 25 mg  25 mg Oral TID PRN Izediuno, Delight Ovens, MD      . magnesium hydroxide (MILK OF MAGNESIA) suspension 30 mL  30 mL Oral Daily PRN Kerry Hough, PA-C      . metoCLOPramide (REGLAN) tablet 10 mg  10 mg Oral QID Laveda Abbe, NP   10 mg at 01/26/17 1149  . metoprolol succinate (TOPROL-XL) 24 hr tablet 50 mg  50 mg Oral Daily Laveda Abbe, NP   50 mg at 01/26/17 0759  . nicotine (NICODERM CQ - dosed in mg/24 hours) patch 21 mg  21 mg Transdermal Daily Cobos, Rockey Situ, MD   21 mg at 01/25/17 0839  . ondansetron (ZOFRAN) tablet 4 mg  4 mg Oral Q8H PRN Laveda Abbe, NP      . pantoprazole (PROTONIX) EC tablet 40 mg  40 mg Oral Daily Laveda Abbe, NP   40 mg at 01/26/17 0751  . QUEtiapine (SEROQUEL) tablet 500 mg  500 mg Oral QHS Deneka Greenwalt, Gerlene Burdock, FNP   500 mg at 01/25/17 2136  . rosuvastatin (CRESTOR) tablet 10 mg  10 mg Oral QHS Laveda Abbe, NP   10 mg at 01/25/17 2135  . traZODone (DESYREL) tablet 200 mg  200 mg Oral QHS Izediuno, Vincent A, MD   200 mg at 01/24/17 2202  . zolpidem (AMBIEN) tablet 5 mg  5 mg Oral QHS PRN Jackelyn Poling, NP   5 mg at 01/25/17 2136    Lab Results: No results found for this or any previous visit (from the past 48  hour(s)).  Blood Alcohol level:  Lab Results  Component Value Date   ETH <10 01/22/2017   ETH <10 01/12/2017    Metabolic Disorder Labs: No results found for: HGBA1C, MPG No results found for: PROLACTIN No results found for: CHOL, TRIG, HDL, CHOLHDL, VLDL, LDLCALC  Physical Findings: AIMS: Facial and Oral  Movements Muscles of Facial Expression: None, normal Lips and Perioral Area: None, normal Jaw: None, normal Tongue: None, normal,Extremity Movements Upper (arms, wrists, hands, fingers): None, normal Lower (legs, knees, ankles, toes): None, normal, Trunk Movements Neck, shoulders, hips: None, normal, Overall Severity Severity of abnormal movements (highest score from questions above): None, normal Incapacitation due to abnormal movements: None, normal Patient's awareness of abnormal movements (rate only patient's report): No Awareness, Dental Status Current problems with teeth and/or dentures?: No Does patient usually wear dentures?: No  CIWA:    COWS:     Musculoskeletal: Strength & Muscle Tone: within normal limits Gait & Station: normal Patient leans: N/A  Psychiatric Specialty Exam: Physical Exam  Nursing note and vitals reviewed. Constitutional: She is oriented to person, place, and time. She appears well-developed and well-nourished.  Cardiovascular: Normal rate.  Respiratory: Effort normal.  Musculoskeletal: Normal range of motion.  Neurological: She is alert and oriented to person, place, and time.  Skin: Skin is warm.    Review of Systems  Constitutional: Negative.   HENT: Negative.   Eyes: Negative.   Respiratory: Negative.   Cardiovascular: Negative.   Gastrointestinal: Negative.   Genitourinary: Negative.   Musculoskeletal: Negative.   Skin: Negative.   Neurological: Negative.   Endo/Heme/Allergies: Negative.   Psychiatric/Behavioral: Positive for depression. Negative for hallucinations and suicidal ideas. The patient is nervous/anxious.      Blood pressure 111/79, pulse 88, temperature 98.4 F (36.9 C), temperature source Oral, resp. rate 16, height 5\' 4"  (1.626 m), weight 91.6 kg (202 lb), last menstrual period 01/05/2017.Body mass index is 34.67 kg/m.  General Appearance: Disheveled  Eye Contact:  Good  Speech:  Clear and Coherent  Volume:  Decreased  Mood:  Depressed  Affect:  Depressed and Flat  Thought Process:  Goal Directed and Descriptions of Associations: Intact  Orientation:  Full (Time, Place, and Person)  Thought Content:  WDL  Suicidal Thoughts:  No  Homicidal Thoughts:  No  Memory:  Immediate;   Good Recent;   Good Remote;   Good  Judgement:  Fair  Insight:  Fair  Psychomotor Activity:  Normal  Concentration:  Concentration: Good and Attention Span: Good  Recall:  Good  Fund of Knowledge:  Good  Language:  Good  Akathisia:  No  Handed:  Right  AIMS (if indicated):     Assets:  Communication Skills Desire for Improvement Financial Resources/Insurance Housing Physical Health Social Support  ADL's:  Intact  Cognition:  WNL  Sleep:  Number of Hours: 6   Problems Addressed:  MDD Severe Borderline Personality Disorder  Treatment Plan Summary: Daily contact with patient to assess and evaluate symptoms and progress in treatment, Medication management and Plan is to:  -Continue Seroquel 500 mg PO QHS for mood stability -Continue Ambien 5 mg PO QHS for insomnia -Continue Prozac 40 mg PO Daily for mood stability -Continue Vistaril 25 mg PO TID PRN for anxiety -Continue Trazodone 200 mg PO QHS for insomnia -Encourage group therapy participation     Maryfrances Bunnellravis B Ashton Belote, FNP 01/26/2017, 12:45 PM

## 2017-01-27 MED ORDER — TRAZODONE HCL 100 MG PO TABS
100.0000 mg | ORAL_TABLET | Freq: Every day | ORAL | Status: DC
  Administered 2017-01-27: 100 mg via ORAL
  Filled 2017-01-27 (×2): qty 1

## 2017-01-27 NOTE — Progress Notes (Signed)
[facility]  01/27/2017 10:01 AM Elaine Mills  MRN:  [id number] Subjective:   41 y.o Caucasian female, married, lives in her parents home with her husband. Background history of Bipolar disorder and Borderline PD. Presented to the ER via EMS. Patient was referred by her outpatient providers. She had presented to them with marked insomnia and anxiety. Had taken extra doses of Ambien, Trazodone and Seroquel. Denied any intent to die "just wanted to sleep". Was reported to be slurred and somnolent at presentation. Had marked psychomotor retardation but not responding to internal stimuli. Routine labs were essentially normal.  UDS was positive for benzodiazepines. No alcohol.   Chart reviewed today. Patient discussed at team today.   Staff reports that patient's blood pressure is running low. Patient Is currently on three different antihypertensive medication. No orthostatic changes. Patient has been somatically focused.   Seen today. Says she is sleeping better but she still struggles to get into sleep. Patient says she feels tired during the day. We discussed her medications and the need to taper sedating agents. Patient more focused on adding more and more medication. Concerns about medication related cognitive impairment reinstated. Patient required multiple redirections as she goes back to seeking new medications. Patient is not focusing on her stressors. She is more focused on her status. She has been asked to sign in voluntarily as there is no basis for involuntary care.    Principal Problem: MDD (major depressive disorder) Diagnosis:   Patient Active Problem List   Diagnosis Date Noted  . MDD (major depressive disorder) [F32.9] 01/24/2017  . Borderline personality disorder (HCC) [F60.3] 01/24/2017  . Bipolar 2 disorder (HCC) [F31.81] 01/23/2017  . Bipolar 1 disorder, depressed, severe (HCC) [F31.4] 01/23/2017  . OVARIAN CYST [N83.209] 11/10/2008  . COUGH [R05] 11/10/2008  .  BIPOLAR DISORDER UNSPECIFIED [F31.9] 11/09/2008  . ASTHMA [J45.909] 11/09/2008  . GASTROESOPHAGEAL REFLUX DISEASE [K21.9] 11/09/2008   Total Time spent with patient: 20 minutes  Past Psychiatric History: As in H&P  Past Medical History:  Past Medical History:  Diagnosis Date  . Anemia   . Anxiety   . Arthritis   . Asthma   . Bipolar 1 disorder (HCC)   . Borderline personality disorder (HCC)   . Chronic back pain   . Chronic knee pain   . Fibromyalgia   . Hiatal hernia   . Hypertension   . IBS (irritable bowel syndrome)   . Lupus   . Migraines   . Neck pain   . Obstructive sleep apnea   . Ovarian torsion   . Panic attack   . PTSD (post-traumatic stress disorder)   . Sciatica   . Scoliosis     Past Surgical History:  Procedure Laterality Date  . BREAST SURGERY    . CHOLECYSTECTOMY    . HERNIA REPAIR    . KNEE SURGERY Left    Family History: History reviewed. No pertinent family history. Family Psychiatric  History: As in H&P  Social History:  Social History   Substance and Sexual Activity  Alcohol Use Yes   Comment: occ     Social History   Substance and Sexual Activity  Drug Use No    Social History   Socioeconomic History  . Marital status: Married    Spouse name: None  . Number of children: None  . Years of education: None  . Highest education level: None  Social Needs  . Financial resource strain: None  . Food insecurity - worry:  None  . Food insecurity - inability: None  . Transportation needs - medical: None  . Transportation needs - non-medical: None  Occupational History  . None  Tobacco Use  . Smoking status: Current Every Day Smoker    Packs/day: 0.50  . Smokeless tobacco: Never Used  Substance and Sexual Activity  . Alcohol use: Yes    Comment: occ  . Drug use: No  . Sexual activity: Not Currently    Birth control/protection: None  Other Topics Concern  . None  Social History Narrative  . None   Additional Social History:                          Sleep: Good  Appetite:  Good  Current Medications: Current Facility-Administered Medications  Medication Dose Route Frequency Provider Last Rate Last Dose  . acetaminophen (TYLENOL) tablet 650 mg  650 mg Oral Q6H PRN Laveda AbbeParks, Laurie Britton, NP   650 mg at 01/26/17 0749  . albuterol (PROVENTIL HFA;VENTOLIN HFA) 108 (90 Base) MCG/ACT inhaler 1-2 puff  1-2 puff Inhalation Q6H PRN Laveda AbbeParks, Laurie Britton, NP   2 puff at 01/27/17 0759  . alum & mag hydroxide-simeth (MAALOX/MYLANTA) 200-200-20 MG/5ML suspension 30 mL  30 mL Oral Q4H PRN Laveda AbbeParks, Laurie Britton, NP      . amLODipine (NORVASC) tablet 5 mg  5 mg Oral Daily Laveda AbbeParks, Laurie Britton, NP   Stopped at 01/27/17 0800  . FLUoxetine (PROZAC) capsule 40 mg  40 mg Oral Daily Izediuno, Delight OvensVincent A, MD   40 mg at 01/26/17 0751  . fluticasone furoate-vilanterol (BREO ELLIPTA) 100-25 MCG/INH 1 puff  1 puff Inhalation Daily Laveda AbbeParks, Laurie Britton, NP   1 puff at 01/27/17 0756  . hydrochlorothiazide (HYDRODIURIL) tablet 25 mg  25 mg Oral Daily Laveda AbbeParks, Laurie Britton, NP   Stopped at 01/27/17 0800  . hydrOXYzine (ATARAX/VISTARIL) tablet 25 mg  25 mg Oral TID PRN Georgiann CockerIzediuno, Vincent A, MD   25 mg at 01/26/17 1345  . magnesium hydroxide (MILK OF MAGNESIA) suspension 30 mL  30 mL Oral Daily PRN Kerry HoughSimon, Spencer E, PA-C      . metoCLOPramide (REGLAN) tablet 10 mg  10 mg Oral QID Laveda AbbeParks, Laurie Britton, NP   10 mg at 01/26/17 2154  . metoprolol succinate (TOPROL-XL) 24 hr tablet 50 mg  50 mg Oral Daily Laveda AbbeParks, Laurie Britton, NP   Stopped at 01/27/17 0800  . nicotine (NICODERM CQ - dosed in mg/24 hours) patch 21 mg  21 mg Transdermal Daily Cobos, Rockey SituFernando A, MD   21 mg at 01/27/17 0756  . ondansetron (ZOFRAN) tablet 4 mg  4 mg Oral Q8H PRN Laveda AbbeParks, Laurie Britton, NP   4 mg at 01/27/17 0756  . pantoprazole (PROTONIX) EC tablet 40 mg  40 mg Oral Daily Laveda AbbeParks, Laurie Britton, NP   40 mg at 01/27/17 0755  . QUEtiapine (SEROQUEL) tablet 500 mg   500 mg Oral QHS Money, Gerlene Burdockravis B, FNP   500 mg at 01/26/17 2154  . rosuvastatin (CRESTOR) tablet 10 mg  10 mg Oral QHS Laveda AbbeParks, Laurie Britton, NP   10 mg at 01/26/17 2154  . traZODone (DESYREL) tablet 200 mg  200 mg Oral QHS Izediuno, Vincent A, MD   200 mg at 01/26/17 2154  . zolpidem (AMBIEN) tablet 5 mg  5 mg Oral QHS PRN Jackelyn PolingBerry, Jason A, NP   5 mg at 01/26/17 2154    Lab Results: No results found for this or any  previous visit (from the past 48 hour(s)).  Blood Alcohol level:  Lab Results  Component Value Date   ETH <10 01/22/2017   ETH <10 01/12/2017    Metabolic Disorder Labs: No results found for: HGBA1C, MPG No results found for: PROLACTIN No results found for: CHOL, TRIG, HDL, CHOLHDL, VLDL, LDLCALC  Physical Findings: AIMS: Facial and Oral Movements Muscles of Facial Expression: None, normal Lips and Perioral Area: None, normal Jaw: None, normal Tongue: None, normal,Extremity Movements Upper (arms, wrists, hands, fingers): None, normal Lower (legs, knees, ankles, toes): None, normal, Trunk Movements Neck, shoulders, hips: None, normal, Overall Severity Severity of abnormal movements (highest score from questions above): None, normal Incapacitation due to abnormal movements: None, normal Patient's awareness of abnormal movements (rate only patient's report): No Awareness, Dental Status Current problems with teeth and/or dentures?: No Does patient usually wear dentures?: No  CIWA:    COWS:     Musculoskeletal: Strength & Muscle Tone: within normal limits Gait & Station: normal Patient leans: N/A  Psychiatric Specialty Exam: Physical Exam  Constitutional: She appears well-developed and well-nourished.  HENT:  Head: Normocephalic and atraumatic.  Respiratory: Effort normal.  Neurological: She is alert.  Psychiatric:  As above     ROS  Blood pressure 95/64, pulse 91, temperature 98.4 F (36.9 C), temperature source Oral, resp. rate 12, height 5\' 4"  (1.626 m),  weight 91.6 kg (202 lb), last menstrual period 01/05/2017.Body mass index is 34.67 kg/m.  Appearance and Behavior: Neatly dressed. Less anxious compared to when she came in. Very clingy. Not in any distress.   Eye Contact:  Good  Speech:  Soft spoken  Volume:  Normal  Mood:  Anxious and Depressed  Affect:  Blunted and mood congruent.   Thought Process:  Linear  Orientation:  Full (Time, Place, and Person)  Thought Content:  Somatically focused. No violent thoughts. No hallucination in any modality.   Suicidal Thoughts:  No  Homicidal Thoughts:  No  Memory:  Unable to assess at this time.   Judgement:  Fair  Insight:  Good  Psychomotor Activity:  Decreased  Concentration:  Concentration: Fair and Attention Span: Fair  Recall: Unable to assess at this time.   Fund of Knowledge:  Fair  Language:  Good  Akathisia:  Negative  Handed:    AIMS (if indicated):     Assets:  Intimacy Resilience  ADL's:  Intact  Cognition:  WNL  Sleep:  Number of Hours: 6     Treatment Plan Summary: Patient has multiple psychosocial stressors. She had severe insomnia and anxiety at presentation. She presented with multiple medications. We have streamlined her medications a bit. Sleep has improved. 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 She is converting her psychosocial stressors to physical issues. We would hold two of her anti-hypertensive medication. I discussed tapering Trazodone as higher dose are not more effective for insomnia. We increased Fluoxetine a couple of days ago. We would evaluate her further.   Psychiatric: MDD Adjustment Disorder Borderline PD  Medical:  Psychosocial:  Family dynamics Financial  Difficulties  PLAN: 1. Decrease Trazodone to 100 mg HS 2. Discontinue Amlodipine and HCTZ. as she is hypotensive.  3. Continue other medications at current dose 4. Continue to monitor mood, behavior and interaction  with peers.      Georgiann Cocker, MD 01/27/2017, 10:01 AM

## 2017-01-27 NOTE — [facility] Group Notes (Signed)
LCSW Group Therapy Note   01/27/2017 1:15pm   Type of Therapy and Topic:  Group Therapy:  Overcoming Obstacles   Participation Level: Active   Description of Group:    In this group patients will be encouraged to explore what they see as obstacles to their own wellness and recovery. They will be guided to discuss their thoughts, feelings, and behaviors related to these obstacles. The group will process together ways to cope with barriers, with attention given to specific choices patients can make. Each patient will be challenged to identify changes they are motivated to make in order to overcome their obstacles. This group will be process-oriented, with patients participating in exploration of their own experiences as well as giving and receiving support and challenge from other group members.   Therapeutic Goals: 1. Patient will identify personal and current obstacles as they relate to admission. 2. Patient will identify barriers that currently interfere with their wellness or overcoming obstacles.  3. Patient will identify feelings, thought process and behaviors related to these barriers. 4. Patient will identify two changes they are willing to make to overcome these obstacles:    Therapeutic Modalities:   Cognitive Behavioral Therapy Solution Focused Therapy Motivational Interviewing Relapse Prevention Therapy  Jonathon JordanLynn B Elexius Minar, MSW, LCSWA 01/27/2017 4:02 PM

## 2017-01-27 NOTE — Progress Notes (Addendum)
D: Patient observed resting in bed this AM. Did come up for medications. Patient observed to be unsteady on feet and she complains of light headedness and weakness. Patient states "I'm nauseated. Should I take my medicine? I think it's the medicine. My blood pressure is low. I couldn't eat the breakfast tray they brought me because of the bacon. Should I go to lunch if I feel like this? I mean, I don't like what they pick out for me. Should you check my blood sugar? What about my blood pressure?" Patient with near constant needs. Displays difficulty processing information and states, "why am I so foggy? I told him to cut back on the medicine." Patient's affect flat, blank and anxious with preoccupied mood. Unable to identify goal.    A: VS reviewed and antihypertensives held. Zofran prn given for nausea along with pitcher of ginger ale. Fluids encouraged. VS rechecked mid morning and improved however slight orthostasis noted. Level III obs in place for safety. Emotional support offered and self inventory encouraged along with programming participation. Discussed POC with MD, SW.  Fall prevention plan in place and reviewed with patient as pt is a high fall risk due to complaints, confusion and decreased awareness of limitations.   R: Patient verbalizes understanding of POC, falls prevention education. On reassess, patient reports feeling "a little better." Patient denies SI/HI/AVH and remains safe on level III obs. Will continue to monitor closely and make verbal contact frequently.

## 2017-01-27 NOTE — Progress Notes (Signed)
Recreation Therapy Notes  Date: 01/27/17 Time: 0930 Location: [address] Dayroom  Group Topic: Stress Management  Goal Area(s) Addresses:  Patient will verbalize importance of using healthy stress management.  Patient will identify positive emotions associated with healthy stress management.   Intervention: Stress Management  Activity :  Meditation.  LRT introduced the stress management technique of meditation.  LRT played a meditation from the Calm app that allowed patients to focus on the things they are grateful for.  Education:  Stress Management, Discharge Planning.   Education Outcome: Acknowledges edcuation/In group clarification offered/Needs additional education  Clinical Observations/Feedback: Pt did not attend group.    Caroll RancherMarjette Raffaele Derise, LRT/CTRS         Caroll RancherLindsay, Cassius Cullinane A 01/27/2017 12:23 PM

## 2017-01-27 NOTE — Progress Notes (Signed)
D   Patient was reluctant to take some of her medications tonight because of her low blood pressure in the mornings   She continues to be slow in her reactions and doing some mild thought blocking  A   Encouraged pt to talk to the doctor about her  Blood pressure medications especially the 24 hour Toprol XL    She did not take her fluid pill today so she was encouraged to elevate her swollen feet    Medications administered and effectiveness monitored    Q 15 min checks   Encouraged fall precautions  R   Pt is safe and receptive to verbal support  Verbalized understanding

## 2017-01-27 NOTE — Plan of Care (Signed)
Patient continues to have difficulty processing information and education provided. Requires re-education frequently and unable to verbalize understanding.  Patient has not engaged in self harm and denies thoughts to do so. Verbal contract in place should that change.

## 2017-01-27 NOTE — Progress Notes (Signed)
Patient ID: Conley Simmondsachael Theissen, female   DOB: 04-20-1975, 41 y.o.   MRN: [id number] PER STATE REGULATIONS 482.30  THIS CHART WAS REVIEWED FOR MEDICAL NECESSITY WITH RESPECT TO THE PATIENT'S ADMISSION/ DURATION OF STAY.  NEXT REVIEW DATE: 01/31/2017 Willa RoughJENNIFER JONES Jancy Sprankle, RN, BSN CASE MANAGER

## 2017-01-28 DIAGNOSIS — F3181 Bipolar II disorder: Secondary | ICD-10-CM

## 2017-01-28 LAB — TSH: TSH: 2.856 u[IU]/mL (ref 0.350–4.500)

## 2017-01-28 LAB — FOLATE: FOLATE: 9.2 ng/mL (ref >5.9)

## 2017-01-28 LAB — VITAMIN B12: VITAMIN B 12: 763 pg/mL (ref 180–914)

## 2017-01-28 MED ORDER — SERTRALINE HCL 25 MG PO TABS
25.0000 mg | ORAL_TABLET | Freq: Every day | ORAL | Status: DC
  Administered 2017-01-28 – 2017-01-29 (×2): 25 mg via ORAL
  Filled 2017-01-28 (×4): qty 1

## 2017-01-28 MED ORDER — ZOLPIDEM TARTRATE 10 MG PO TABS
10.0000 mg | ORAL_TABLET | Freq: Every evening | ORAL | Status: DC | PRN
  Administered 2017-01-28: 10 mg via ORAL
  Filled 2017-01-28: qty 1

## 2017-01-28 MED ORDER — QUETIAPINE FUMARATE 300 MG PO TABS
300.0000 mg | ORAL_TABLET | Freq: Every day | ORAL | Status: DC
  Administered 2017-01-28: 300 mg via ORAL
  Filled 2017-01-28 (×4): qty 1

## 2017-01-28 MED ORDER — TRAZODONE HCL 50 MG PO TABS
25.0000 mg | ORAL_TABLET | Freq: Every evening | ORAL | Status: DC | PRN
  Administered 2017-01-28: 25 mg via ORAL
  Filled 2017-01-28: qty 1

## 2017-01-28 NOTE — Progress Notes (Signed)
Adult Psychoeducational Group Note  Date:  01/28/2017 Time:  9:35 PM  Group Topic/Focus:  Wrap-Up Group:   The focus of this group is to help patients review their daily goal of treatment and discuss progress on daily workbooks.  Participation Level:  Active  Participation Quality:  Appropriate  Affect:  Appropriate  Cognitive:  Appropriate  Insight: Appropriate  Engagement in Group:  Engaged  Modes of Intervention:  Activity  Additional Comments:  Pt rated her self a 4. Goal is to cope with anxiety and get adjusted to medications.  Elaine Mills 01/28/2017, 9:35 PM

## 2017-01-28 NOTE — [facility] Group Notes (Signed)
LCSW Group Therapy Note  01/28/2017 1:15pm  Type of Therapy/Topic:  Group Therapy:  Feelings about Diagnosis  Participation Level: Active  Description of Group:   This group will allow patients to explore their thoughts and feelings about diagnoses they have received. Patients will be guided to explore their level of understanding and acceptance of these diagnoses. Facilitator will encourage patients to process their thoughts and feelings about the reactions of others to their diagnosis and will guide patients in identifying ways to discuss their diagnosis with significant others in their lives. This group will be process-oriented, with patients participating in exploration of their own experiences, giving and receiving support, and processing challenge from other group members.   Therapeutic Goals: 1. Patient will demonstrate understanding of diagnosis as evidenced by identifying two or more symptoms of the disorder 2. Patient will be able to express two feelings regarding the diagnosis 3. Patient will demonstrate their ability to communicate their needs through discussion and/or role play   Therapeutic Modalities:   Cognitive Behavioral Therapy Brief Therapy Feelings Identification    Elaine JordanLynn B Shoshana Mills, MSW, [facility] 01/28/2017 3:41 PM

## 2017-01-28 NOTE — Progress Notes (Signed)
Recreation Therapy Notes  Animal-Assisted Activity (AAA) Program Checklist/Progress Notes Patient Eligibility Criteria Checklist & Daily Group note for Rec TxIntervention  Date: 11.13.2018 Time: 2:45pm Location: [address] Dayroom   AAA/T Program Assumption of Risk Form signed by Patient/ or Parent Legal Guardian Yes  Patient is free of allergies or sever asthma Yes  Patient reports no fear of animals Yes  Patient reports no history of cruelty to animals Yes  Patient understands his/her participation is voluntary Yes  Patient washes hands before animal contact Yes  Patient washes hands after animal contact Yes  Behavioral Response: Appropriate   Education:Hand Washing, Appropriate Animal Interaction   Education Outcome: Acknowledges education.   Clinical Observations/Feedback: Patient attended session and interacted appropriately with therapy dog and peers.   Clara Smolen L Nastasia Kage, LRT/CTRS        Angila Wombles L 01/28/2017 3:11 PM 

## 2017-01-28 NOTE — Progress Notes (Signed)
D: Pt presents with depressed affect and mood. Per pt "I'm just confused and foggy, I don't know what to do, I don't want to be release home'. Denies SI, HI, AVH and pain. Continues to be somatic and needy in her interactions with staff. Visible in milieu at intervals during activities, attended unit groups as scheduled when prompted.  A: Q 15 minutes safety checks maintained on and off unit without outburst. Scheduled and PRN (Vistaril--anxiety)  medications given as per order with verbal education and effects monitored. Support and encouragement offered to throughout this shift.  R: Pt receptive to care. Tolerates all PO intake well. Remains safe on and off unit. Denies concerns at this time. POC continues for safety and mood stability.

## 2017-01-28 NOTE — Progress Notes (Signed)
D   Pt upset because she couldn't reach her husband on the phone    She said the phone was off the hook   When asked when was the last time she talked to her husband she said a time that was just 4 hours ago   She asked if she should call the police to have them go check on her husband   She was ruminating over medication changes and was worried that she would not sleep  A   Verbal support was given   Medications administered and effectiveness monitored   Pt was encouraged to think more positive and wait until the morning to try her husband again  R   She said the doctor said they were going to discharge her tomorrow and she didn't have a ride if she couldn't contact her husband   She remains safe

## 2017-01-28 NOTE — Progress Notes (Addendum)
[facility]  01/28/2017 9:35 AM Elaine SimmondsRachael Pikus  MRN:  [id number] Subjective:   Patient reports she feels depressed, and describes poor energy.  She reports a sense of anxiety and worry. Denies suicidal ideations. She complains of cognitive symptoms, such as feeling her memory is not as good as it was, and that she often feels confused . States she feels tired, " slowed down", " like a zombie" at times.   Objective: 41 year old married female, lives with parents and husband . She reports she is on disability because of a prior history of lupus diagnosis. On admission presented with significant anxiety, depression, insomnia. Reported feeling overwhelmed, and described significant stressors, such as mother having advanced cancer and husband being unemployed . Chart notes indicate prior history of Bipolar Disorder. She denies drug or alcohol abuse . States that prior to admission was on Seroquel, Ambien, Prozac, Xanax. As per staff report patient has been anxious, ruminative, somatically focused . Today denies suicidal ideations, but reports feeling sad, anxious, and states her mood is 3/10. Presents ruminative. Currently is on Seroquel 500 mgrs QHS, Ambien 5 mgrs QHS, Trazodone 100 mgrs QHS, Prozac 40 mgrs QDAY. She reports feeling intermittently dizzy- does not describe other side effects at this time, but states she feels medications are not helping much. States she has continued to sleep poorly, states she slept about 3 hours last night. Presents sad, anxious, ruminative, denies suicidal ideations, denies hallucinations, does not appear internally preoccupied . Of note, patient is alert, attentive, and oriented x 3.  01/12/17 CNS MRI reported as normal .       Principal Problem: MDD (major depressive disorder) Diagnosis:   Patient Active Problem List   Diagnosis Date Noted  . MDD (major depressive disorder) [F32.9] 01/24/2017  . Borderline personality disorder (HCC) [F60.3]  01/24/2017  . Bipolar 2 disorder (HCC) [F31.81] 01/23/2017  . Bipolar 1 disorder, depressed, severe (HCC) [F31.4] 01/23/2017  . OVARIAN CYST [N83.209] 11/10/2008  . COUGH [R05] 11/10/2008  . BIPOLAR DISORDER UNSPECIFIED [F31.9] 11/09/2008  . ASTHMA [J45.909] 11/09/2008  . GASTROESOPHAGEAL REFLUX DISEASE [K21.9] 11/09/2008   Total Time spent with patient: 20 minutes  Past Psychiatric History: As in H&P  Past Medical History:  Past Medical History:  Diagnosis Date  . Anemia   . Anxiety   . Arthritis   . Asthma   . Bipolar 1 disorder (HCC)   . Borderline personality disorder (HCC)   . Chronic back pain   . Chronic knee pain   . Fibromyalgia   . Hiatal hernia   . Hypertension   . IBS (irritable bowel syndrome)   . Lupus   . Migraines   . Neck pain   . Obstructive sleep apnea   . Ovarian torsion   . Panic attack   . PTSD (post-traumatic stress disorder)   . Sciatica   . Scoliosis     Past Surgical History:  Procedure Laterality Date  . BREAST SURGERY    . CHOLECYSTECTOMY    . HERNIA REPAIR    . KNEE SURGERY Left    Family History: History reviewed. No pertinent family history. Family Psychiatric  History: As in H&P  Social History:  Social History   Substance and Sexual Activity  Alcohol Use Yes   Comment: occ     Social History   Substance and Sexual Activity  Drug Use No    Social History   Socioeconomic History  . Marital status: Married    Spouse  name: None  . Number of children: None  . Years of education: None  . Highest education level: None  Social Needs  . Financial resource strain: None  . Food insecurity - worry: None  . Food insecurity - inability: None  . Transportation needs - medical: None  . Transportation needs - non-medical: None  Occupational History  . None  Tobacco Use  . Smoking status: Current Every Day Smoker    Packs/day: 0.50  . Smokeless tobacco: Never Used  Substance and Sexual Activity  . Alcohol use: Yes     Comment: occ  . Drug use: No  . Sexual activity: Not Currently    Birth control/protection: None  Other Topics Concern  . None  Social History Narrative  . None   Additional Social History:   Sleep: Fair  Appetite:  Fair  Current Medications: Current Facility-Administered Medications  Medication Dose Route Frequency Provider Last Rate Last Dose  . acetaminophen (TYLENOL) tablet 650 mg  650 mg Oral Q6H PRN Laveda Abbe, NP   650 mg at 01/26/17 0749  . albuterol (PROVENTIL HFA;VENTOLIN HFA) 108 (90 Base) MCG/ACT inhaler 1-2 puff  1-2 puff Inhalation Q6H PRN Laveda Abbe, NP   2 puff at 01/27/17 0759  . alum & mag hydroxide-simeth (MAALOX/MYLANTA) 200-200-20 MG/5ML suspension 30 mL  30 mL Oral Q4H PRN Laveda Abbe, NP      . FLUoxetine (PROZAC) capsule 40 mg  40 mg Oral Daily Georgiann Cocker, MD   40 mg at 01/28/17 0906  . fluticasone furoate-vilanterol (BREO ELLIPTA) 100-25 MCG/INH 1 puff  1 puff Inhalation Daily Laveda Abbe, NP   1 puff at 01/28/17 0908  . hydrOXYzine (ATARAX/VISTARIL) tablet 25 mg  25 mg Oral TID PRN Georgiann Cocker, MD   25 mg at 01/26/17 1345  . magnesium hydroxide (MILK OF MAGNESIA) suspension 30 mL  30 mL Oral Daily PRN Kerry Hough, PA-C      . metoCLOPramide (REGLAN) tablet 10 mg  10 mg Oral QID Laveda Abbe, NP   10 mg at 01/28/17 0906  . metoprolol succinate (TOPROL-XL) 24 hr tablet 50 mg  50 mg Oral Daily Laveda Abbe, NP   50 mg at 01/28/17 0908  . nicotine (NICODERM CQ - dosed in mg/24 hours) patch 21 mg  21 mg Transdermal Daily Ajia Chadderdon, Rockey Situ, MD   21 mg at 01/28/17 0913  . ondansetron (ZOFRAN) tablet 4 mg  4 mg Oral Q8H PRN Laveda Abbe, NP   4 mg at 01/27/17 0756  . pantoprazole (PROTONIX) EC tablet 40 mg  40 mg Oral Daily Laveda Abbe, NP   40 mg at 01/28/17 1610  . QUEtiapine (SEROQUEL) tablet 500 mg  500 mg Oral QHS Money, Feliz Beam B, FNP   500 mg at 01/27/17 2210  .  rosuvastatin (CRESTOR) tablet 10 mg  10 mg Oral QHS Laveda Abbe, NP   10 mg at 01/27/17 2210  . traZODone (DESYREL) tablet 100 mg  100 mg Oral QHS Izediuno, Vincent A, MD   100 mg at 01/27/17 2210  . zolpidem (AMBIEN) tablet 5 mg  5 mg Oral QHS PRN Nira Conn A, NP   5 mg at 01/27/17 2210    Lab Results: No results found for this or any previous visit (from the past 48 hour(s)).  Blood Alcohol level:  Lab Results  Component Value Date   ETH <10 01/22/2017   ETH <10 01/12/2017  Metabolic Disorder Labs: No results found for: HGBA1C, MPG No results found for: PROLACTIN No results found for: CHOL, TRIG, HDL, CHOLHDL, VLDL, LDLCALC  Physical Findings: AIMS: Facial and Oral Movements Muscles of Facial Expression: None, normal Lips and Perioral Area: None, normal Jaw: None, normal Tongue: None, normal,Extremity Movements Upper (arms, wrists, hands, fingers): None, normal Lower (legs, knees, ankles, toes): None, normal, Trunk Movements Neck, shoulders, hips: None, normal, Overall Severity Severity of abnormal movements (highest score from questions above): None, normal Incapacitation due to abnormal movements: None, normal Patient's awareness of abnormal movements (rate only patient's report): No Awareness, Dental Status Current problems with teeth and/or dentures?: No Does patient usually wear dentures?: No  CIWA:    COWS:     Musculoskeletal: Strength & Muscle Tone: within normal limits Gait & Station: normal Patient leans: N/A  Psychiatric Specialty Exam: Physical Exam  Constitutional: She appears well-developed and well-nourished.  HENT:  Head: Normocephalic and atraumatic.  Respiratory: Effort normal.  Neurological: She is alert.  Psychiatric:  As above     ROS reports intermittent dizziness, no chest pain, no shortness of breath, no vomiting   Blood pressure 113/83, pulse (!) 110, temperature 98.4 F (36.9 C), temperature source Oral, resp. rate 12,  height 5\' 4"  (1.626 m), weight 91.6 kg (202 lb), last menstrual period 01/05/2017.Body mass index is 34.67 kg/m.  Appearance and Behavior: fairly groomed, good eye contact   Eye Contact:  Good  Speech: slow  Volume:  Normal  Mood:  anxious, depressed   Affect:  Reports anxiety, and affect presents sad, blunted   Thought Process:  Linear and Descriptions of Associations: Intact  Orientation:  Other:  presents alert and attentive , oriented x 3   Thought Content:  Denies any hallucinations, no delusions, focused on somatic issues.  Suicidal Thoughts:  No denies any suicidal or self injurious ideations, denies any homicidal or violent ideations  Homicidal Thoughts:  No  Memory:  Fully oriented x 3  3/3 immediate, 1/3 at 5 minutes  Able to spell 5 letter word backward without mistakes Able to name common objects Able to follow three stage instruction  Able to accurately copy diagram    Judgement:  Fair  Insight:  Fair  Psychomotor Activity:  Decreased  Concentration:  Concentration: Fair and Attention Span: Fair  Recall: see above   Fund of Knowledge:  Fair  Language:  Good  Akathisia:  Negative  Handed:    AIMS (if indicated):     Assets:  Intimacy Resilience  ADL's:  Intact  Cognition:  WNL  Sleep:  Number of Hours: 6.5   Assessment - 41 year old married female, reports history of Bipolar Disorder. Presented for anxiety, depression, insomnia, and a subjective feeling of cognitive decline, poor memory . She presents alert, oriented x 3, recall is 3/3 immediate and 1/3 at 5 minutes   At this time presents sad, anxious, ruminative, but no SI, and is future oriented, stating she is hoping to discharge soon. She reports long history of insomnia, and states she feels Ambien is the only medication that has " really helped ".  She reports ongoing poor sleep in spite of Ambien, Seroquel, Trazodone . She states  " I feel like the medications are making me feel like a zombie",which she  attributes in particular to Prozac and  Seroquel ( has been on Fluoxetine for a few weeks,and on Seroquel for several months) . States she has been on Zoloft in the past, states is  was well tolerated, and feels it may have helped . Will order labs as below ( of note, patient states that she had RPR and HIV testing in September which was informed to her as negative )   Treatment Plan Summary: Encourage group and milieu participation to work on coping skills and symptom reduction Treatment team working on disposition planning   Decrease Seroquel to 300 mgrs QHS , for mood disorder- taper Seroquel to help address side effects Decrease Trazodone to 25 mgrs QHS PRN for insomnia  Discontinue Prozac  Start Zoloft 25 mgrs QDAY initially  Increase Ambien to 10 mgrs QHS for insomnia  Check TSH, B12,Folate.         Craige Cotta, MD 01/28/2017, 9:35 AM   Patient ID: Elaine Mills, female   DOB: [birth year], 41 y.o.   MRN: [id number]

## 2017-01-29 LAB — VITAMIN D 25 HYDROXY (VIT D DEFICIENCY, FRACTURES): Vit D, 25-Hydroxy: 22.4 ng/mL — ABNORMAL LOW (ref 30.0–100.0)

## 2017-01-29 MED ORDER — SERTRALINE HCL 25 MG PO TABS: 25.0000 mg | ORAL_TABLET | Freq: Every day | ORAL | 0 refills | Status: DC

## 2017-01-29 MED ORDER — QUETIAPINE FUMARATE 300 MG PO TABS: 300.0000 mg | ORAL_TABLET | Freq: Every day | ORAL | 0 refills | Status: DC

## 2017-01-29 MED ORDER — VITAMIN D3 25 MCG (1000 UNIT) PO TABS: 1000.0000 [IU] | ORAL_TABLET | Freq: Every day | ORAL | 0 refills | Status: DC

## 2017-01-29 MED ORDER — ZOLPIDEM TARTRATE 10 MG PO TABS
10.0000 mg | ORAL_TABLET | Freq: Every evening | ORAL | 0 refills | Status: DC | PRN
Start: 2017-01-29 — End: 2017-06-27

## 2017-01-29 MED ORDER — HYDROXYZINE HCL 25 MG PO TABS: 25.0000 mg | ORAL_TABLET | Freq: Three times a day (TID) | ORAL | 0 refills | Status: DC | PRN

## 2017-01-29 MED ORDER — VITAMIN D3 25 MCG (1000 UNIT) PO TABS
1000.0000 [IU] | ORAL_TABLET | Freq: Every day | ORAL | Status: DC
  Administered 2017-01-29: 1000 [IU] via ORAL
  Filled 2017-01-29 (×3): qty 1

## 2017-01-29 NOTE — Progress Notes (Signed)
Recreation Therapy Notes  Date:  01/29/17 Time: 0930 Location: [address] Dayroom  Group Topic: Stress Management  Goal Area(s) Addresses:  Patient will verbalize importance of using healthy stress management.  Patient will identify positive emotions associated with healthy stress management.   Intervention: Stress Management  Activity :  Guided Imagery.  LRT introduced the stress management technique of guided imagery.  LRT read a script to help patients envision their safe and peaceful place.  Patients were to follow along as LRT read script.  Education:  Stress Management, Discharge Planning.   Education Outcome: Acknowledges edcuation/In group clarification offered/Needs additional education  Clinical Observations/Feedback: Pt did not attend group.    Caroll RancherMarjette Masaru Chamberlin, LRT/CTRS         Caroll RancherLindsay, Mariama Saintvil A 01/29/2017 12:39 PM

## 2017-01-29 NOTE — Progress Notes (Signed)
Pt discharged with her husband. Pt was ambulatory, stable and appreciative at that time. All papers and prescriptions were given and valuables returned. Verbal understanding expressed. Denies SI/HI and A/VH. Pt given opportunity to express concerns and ask questions.

## 2017-01-29 NOTE — [facility] Suicide Risk Assessment (Signed)
[facility]   Principal Problem: MDD (major depressive disorder) Discharge Diagnoses:  Patient Active Problem List   Diagnosis Date Noted  . MDD (major depressive disorder) [F32.9] 01/24/2017  . Borderline personality disorder (HCC) [F60.3] 01/24/2017  . Bipolar 2 disorder (HCC) [F31.81] 01/23/2017  . Bipolar 1 disorder, depressed, severe (HCC) [F31.4] 01/23/2017  . OVARIAN CYST [N83.209] 11/10/2008  . COUGH [R05] 11/10/2008  . BIPOLAR DISORDER UNSPECIFIED [F31.9] 11/09/2008  . ASTHMA [J45.909] 11/09/2008  . GASTROESOPHAGEAL REFLUX DISEASE [K21.9] 11/09/2008    Total Time spent with patient: 30 minutes  Musculoskeletal: Strength & Muscle Tone: within normal limits Gait & Station: normal Patient leans: N/A  Psychiatric Specialty Exam: ROS denies headache, no chest pain, no vomiting   Blood pressure 113/66, pulse 93, temperature 97.8 F (36.6 C), temperature source Oral, resp. rate 18, height 5\' 4"  (1.626 m), weight 91.6 kg (202 lb), last menstrual period 01/05/2017.Body mass index is 34.67 kg/m.  General Appearance: Fairly Groomed  Patent attorneyye Contact::  Good  Speech:  less slowed [phone]  Volume:  Normal  Mood:  reports feeling anxious, acknowledges partial improvement compared to admission   Affect:  blunted  Thought Process:  Linear and Descriptions of Associations: Intact- less slowed   Orientation:  Other:  oriented x 3   Thought Content:  denies hallucinations, no delusions expressed, does not appear internally preoccupied   Suicidal Thoughts:  No denies suicidal or self injurious ideations, denies homicidal or violent ideations   Homicidal Thoughts:  No  Memory:  immediate is 3/3 , 5 minutes is 2/3. Today performed better on MMSE - scored 28/30.   Judgement:  Other:  fair- improving   Insight:  fair- improving   Psychomotor Activity:  improved, less slowed   Concentration:  Fair- improved   Recall:  Good  Fund of Knowledge:Good  Language: Good   Akathisia:  NA  Handed:  Right  AIMS (if indicated):     Assets:  Desire for Improvement Resilience Social Support  Sleep:  Number of Hours: 6.25  Cognition: WNL  ADL's:  Intact   Mental Status Per Nursing Assessment::   On Admission:     Demographic Factors:  41 year old married female  Loss Factors: Disability, chronic psychiatric illness   Historical Factors: History of Mood Disorder, has been diagnosed with Bipolar Disorder in the past. Reports history of insomnia , anxiety   Risk Reduction Factors:   Sense of responsibility to family, Living with another person, especially a relative and Positive social support  Continued Clinical Symptoms:  Patient presents alert, attentive , reports she is feeling better, minimizes depression today, reports ongoing anxiety as major issue. Affect presents blunted, speech presents improved , more verbal today. No thought disorder noted , denies suicidal or self injurious ideations, denies homicidal or violent ideations, denies hallucinations, and does not appear internally preoccupied . MMSE - 28/30. Fully oriented x 3. Denies medication side effects at this time- we reviewed how her cognition presents improved after medications were lowered. She acknowledges but is reluctant to taper doses further at this time due to concern for insomnia. Vitamin D level is low- will start Vitamin D supplement. TSH is normal. With patient's consent and in her presence I spoke with husband - husband corroborates that patient's current presentation represents a partial but significant improvement compared to how she was presenting prior to admission and is agreeing to discharge. He reports he plans to  keep patient's medications as he was concerned she  may have been taking excessive doses in the past . He also confirms that they have an upcoming appointment to see a neurologist for further work up/management. He will pick her up later today.   Cognitive  Features That Contribute To Risk:  No gross cognitive deficits noted upon discharge. Is oriented x 3   Suicide Risk:  Mild:  Suicidal ideation of limited frequency, intensity, duration, and specificity.  There are no identifiable plans, no associated intent, mild dysphoria and related symptoms, good self-control (both objective and subjective assessment), few other risk factors, and identifiable protective factors, including available and accessible social support.  Follow-up Information    [facility] Follow up.   Specialty:  Behavioral Health Why:  Social worker has made a referral on your behalf for the Intensive Outpatient Program. Awaiting time and date for your appointment.  Contact information: [address] [place] [id number] [phone]          Plan Of Care/Follow-up recommendations:  Activity:  as tolerated  Diet:  regular Tests:  NA Other:  see below  Patient is requesting discharge and there are no ongoing grounds for involuntary commitment at this time Plans to return home Plans to follow up as above and also plans to follow up with an outpatient neurologist for further management .  Craige CottaFernando A Ayriana Wix, MD 01/29/2017, 10:54 AM
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
## 2017-02-08 ENCOUNTER — Other Ambulatory Visit: Payer: Self-pay

## 2017-02-08 ENCOUNTER — Emergency Department (HOSPITAL_BASED_OUTPATIENT_CLINIC_OR_DEPARTMENT_OTHER)
Admission: EM | Admit: 2017-02-08 | Discharge: 2017-02-08 | Disposition: A | Discharge status: Home / Self Care | Payer: Medicare Other | Attending: Emergency Medicine | Admitting: Emergency Medicine

## 2017-02-08 ENCOUNTER — Encounter (HOSPITAL_BASED_OUTPATIENT_CLINIC_OR_DEPARTMENT_OTHER): Payer: Self-pay | Admitting: Emergency Medicine

## 2017-02-08 DIAGNOSIS — R1013 Epigastric pain: Secondary | ICD-10-CM | POA: Diagnosis not present

## 2017-02-08 DIAGNOSIS — J45909 Unspecified asthma, uncomplicated: Secondary | ICD-10-CM | POA: Diagnosis not present

## 2017-02-08 DIAGNOSIS — I10 Essential (primary) hypertension: Secondary | ICD-10-CM | POA: Diagnosis not present

## 2017-02-08 DIAGNOSIS — D649 Anemia, unspecified: Secondary | ICD-10-CM | POA: Insufficient documentation

## 2017-02-08 DIAGNOSIS — Z91040 Latex allergy status: Secondary | ICD-10-CM | POA: Insufficient documentation

## 2017-02-08 DIAGNOSIS — Z79899 Other long term (current) drug therapy: Secondary | ICD-10-CM | POA: Insufficient documentation

## 2017-02-08 DIAGNOSIS — R55 Syncope and collapse: Secondary | ICD-10-CM | POA: Diagnosis not present

## 2017-02-08 DIAGNOSIS — G8929 Other chronic pain: Secondary | ICD-10-CM | POA: Insufficient documentation

## 2017-02-08 DIAGNOSIS — R42 Dizziness and giddiness: Secondary | ICD-10-CM

## 2017-02-08 DIAGNOSIS — F17210 Nicotine dependence, cigarettes, uncomplicated: Secondary | ICD-10-CM | POA: Insufficient documentation

## 2017-02-08 LAB — CBC WITH DIFFERENTIAL/PLATELET
BASOS PCT: 0 %
Basophils Absolute: 0 10*3/uL (ref 0.0–0.1)
EOS ABS: 0 10*3/uL (ref 0.0–0.7)
EOS PCT: 0 %
HCT: 40.4 % (ref 36.0–46.0)
HEMOGLOBIN: 13.1 g/dL (ref 12.0–15.0)
LYMPHS ABS: 2 10*3/uL (ref 0.7–4.0)
Lymphocytes Relative: 18 %
MCH: 26.2 pg (ref 26.0–34.0)
MCHC: 32.4 g/dL (ref 30.0–36.0)
MCV: 80.8 fL (ref 78.0–100.0)
MONOS PCT: 5 %
Monocytes Absolute: 0.6 10*3/uL (ref 0.1–1.0)
NEUTROS PCT: 77 %
Neutro Abs: 8.4 10*3/uL — ABNORMAL HIGH (ref 1.7–7.7)
PLATELETS: 309 10*3/uL (ref 150–400)
RBC: 5 MIL/uL (ref 3.87–5.11)
RDW: 14.7 % (ref 11.5–15.5)
WBC: 11 10*3/uL — AB (ref 4.0–10.5)

## 2017-02-08 LAB — COMPREHENSIVE METABOLIC PANEL
ALBUMIN: 3.8 g/dL (ref 3.5–5.0)
ALK PHOS: 76 U/L (ref 38–126)
ALT: 31 U/L (ref 14–54)
AST: 22 U/L (ref 15–41)
Anion gap: 7 (ref 5–15)
BUN: 9 mg/dL (ref 6–20)
CALCIUM: 9.1 mg/dL (ref 8.9–10.3)
CHLORIDE: 104 mmol/L (ref 101–111)
CO2: 25 mmol/L (ref 22–32)
Creatinine, Ser: 0.66 mg/dL (ref 0.44–1.00)
GFR calc Af Amer: >60 mL/min (ref >60)
GFR calc non Af Amer: >60 mL/min (ref >60)
GLUCOSE: 99 mg/dL (ref 65–99)
Potassium: 4.1 mmol/L (ref 3.5–5.1)
SODIUM: 136 mmol/L (ref 135–145)
Total Bilirubin: 0.6 mg/dL (ref 0.3–1.2)
Total Protein: 7.7 g/dL (ref 6.5–8.1)

## 2017-02-08 LAB — AMMONIA: Ammonia: 12 umol/L (ref 9–35)

## 2017-02-08 LAB — URINALYSIS, ROUTINE W REFLEX MICROSCOPIC
BILIRUBIN URINE: NEGATIVE
Glucose, UA: NEGATIVE mg/dL
HGB URINE DIPSTICK: NEGATIVE
KETONES UR: NEGATIVE mg/dL
Leukocytes, UA: NEGATIVE
NITRITE: NEGATIVE
Protein, ur: NEGATIVE mg/dL
Specific Gravity, Urine: 1.015 (ref 1.005–1.030)
pH: 6.5 (ref 5.0–8.0)

## 2017-02-08 LAB — ETHANOL: Alcohol, Ethyl (B): <10 mg/dL (ref <10)

## 2017-02-08 LAB — OCCULT BLOOD X 1 CARD TO LAB, STOOL: Fecal Occult Bld: NEGATIVE

## 2017-02-08 LAB — TROPONIN I: Troponin I: <0.03 ng/mL (ref <0.03)

## 2017-02-08 LAB — RAPID URINE DRUG SCREEN, HOSP PERFORMED
AMPHETAMINES: NOT DETECTED
BARBITURATES: NOT DETECTED
BENZODIAZEPINES: POSITIVE — AB
COCAINE: NOT DETECTED
OPIATES: POSITIVE — AB
TETRAHYDROCANNABINOL: NOT DETECTED

## 2017-02-08 LAB — LIPASE, BLOOD: Lipase: 23 U/L (ref 11–51)

## 2017-02-08 LAB — PREGNANCY, URINE: Preg Test, Ur: NEGATIVE

## 2017-02-08 MED ORDER — FAMOTIDINE IN NACL 20-0.9 MG/50ML-% IV SOLN
20.0000 mg | Freq: Once | INTRAVENOUS | Status: AC
  Administered 2017-02-08: 20 mg via INTRAVENOUS
  Filled 2017-02-08: qty 50

## 2017-02-08 MED ORDER — SODIUM CHLORIDE 0.9 % IV BOLUS (SEPSIS)
1000.0000 mL | Freq: Once | INTRAVENOUS | Status: AC
  Administered 2017-02-08: 1000 mL via INTRAVENOUS

## 2017-02-08 NOTE — ED Notes (Signed)
ED Provider at bedside. 

## 2017-02-08 NOTE — ED Notes (Signed)
Pt's husband states pt became pale while at grocery store just pta. States concerned she would pass out. States checked her blood sugar and it was 166 (pt is not dx with diabetes). Reports pt is on multiple medications for bi-polar disorder and he is concerned [place] may be contributing to memory loss since June. She has had multiple evaluations for same. Pt voices "Can you help me?" Requesting EEG that has been recommended by Neuro. Explained we were not able to do that at this facility and pt became tearful repeating "You can't help me". Husband at bedside is supportive. States pt has had bloodwork this week and saw Neuro on Friday. Encouraged pt to wait for eval by EDP.

## 2017-02-08 NOTE — ED Triage Notes (Signed)
Patient reports that she has ST and Long term Memory loss  - has been off and on Ambien  - patient is nervous in triage and trying to report what is going on. Family member as well interjecting and interrupting frequently. Family member showing signs of reaction to possibly Ambien. Patient is upset with family member in room because she is trying to talking and he will not let her. Unable to understand why the patient is here at this time

## 2017-02-08 NOTE — Discharge Instructions (Signed)
Please follow with your primary care doctor in the next 2 days for a check-up. They must obtain records for further management.  ° °Do not hesitate to return to the Emergency Department for any new, worsening or concerning symptoms.  ° °

## 2017-02-08 NOTE — ED Provider Notes (Signed)
[facility] EMERGENCY DEPARTMENT Provider Note   CSN: [id number] Arrival date & time: 02/08/17  1256     History   Chief Complaint Chief Complaint  Patient presents with  . Altered Mental Status   HPI   Blood pressure 112/67, pulse 90, temperature 98.2 F (36.8 C), temperature source Oral, resp. rate 20, height 5\' 5"  (1.651 m), weight 93 kg (205 lb), last menstrual period 01/05/2017, SpO2 99 %.  Elaine Mills is a 41 y.o. female with past medical history significant for bipolar, borderline personality disorder, fibromyalgia, lupus and PTSD presenting for evaluation of feeling very faint and pale when standing.  She has been eating and drinking slightly less than normal with mild diarrhea no abdominal pain but she reports increasing abdominal girth no nausea vomiting, fever or chills.  She states that she has been having memory issues.  Chart review shows that she was assessed by behavioral health as an inpatient and then discharged, she is followed with neurology who recommends obtaining a neuropsych consult.  Her husband has not been able to arrange that just yet.  There is been no acute change in her mental status.  No new medication however her husband is very concerned that the side effects that she is having are from Ambien on care prescribes this   Past Medical History:  Diagnosis Date  . Anemia   . Anxiety   . Arthritis   . Asthma   . Bipolar 1 disorder (HCC)   . Borderline personality disorder (HCC)   . Chronic back pain   . Chronic knee pain   . Fibromyalgia   . Hiatal hernia   . Hypertension   . IBS (irritable bowel syndrome)   . Lupus   . Migraines   . Neck pain   . Obstructive sleep apnea   . Ovarian torsion   . Panic attack   . PTSD (post-traumatic stress disorder)   . Sciatica   . Scoliosis     Patient Active Problem List   Diagnosis Date Noted  . MDD (major depressive disorder) 01/24/2017  . Borderline personality disorder (HCC)  01/24/2017  . Bipolar 2 disorder (HCC) 01/23/2017  . Bipolar 1 disorder, depressed, severe (HCC) 01/23/2017  . OVARIAN CYST 11/10/2008  . COUGH 11/10/2008  . BIPOLAR DISORDER UNSPECIFIED 11/09/2008  . ASTHMA 11/09/2008  . GASTROESOPHAGEAL REFLUX DISEASE 11/09/2008    Past Surgical History:  Procedure Laterality Date  . BREAST SURGERY    . CHOLECYSTECTOMY    . HERNIA REPAIR    . KNEE SURGERY Left     OB History    No data available       Home Medications    Prior to Admission medications   Medication Sig Start Date End Date Taking? Authorizing Provider  albuterol (PROVENTIL HFA;VENTOLIN HFA) 108 (90 BASE) MCG/ACT inhaler Inhale 1-2 puffs into the lungs every 6 (six) hours as needed for wheezing or shortness of breath. 04/03/13   Gerhard Munch, MD  cholecalciferol (VITAMIN D) 1000 units tablet Take 1 tablet (1,000 Units total) daily by mouth. For low vitamin D 01/29/17   Money, Feliz Beam B, FNP  fluticasone furoate-vilanterol (BREO ELLIPTA) 100-25 MCG/INH AEPB Inhale 1 puff into the lungs daily.    [provider]  hydrOXYzine (ATARAX/VISTARIL) 25 MG tablet Take 1 tablet (25 mg total) 3 (three) times daily as needed by mouth for anxiety. 01/29/17   Money, Gerlene Burdock, FNP  metoCLOPramide (REGLAN) 10 MG tablet Take 10 mg by mouth 4 (four)  times daily.    [provider]  metoprolol succinate (TOPROL-XL) 50 MG 24 hr tablet Take 50 mg by mouth daily. Take with or immediately following a meal.    [provider]  omeprazole (PRILOSEC) 40 MG capsule Take 40 mg by mouth daily before breakfast.    [provider]  QUEtiapine (SEROQUEL) 300 MG tablet Take 1 tablet (300 mg total) at bedtime by mouth. For mood control 01/29/17   Money, Feliz Beamravis B, FNP  rosuvastatin (CRESTOR) 10 MG tablet Take 10 mg by mouth at bedtime.     [provider]  sertraline (ZOLOFT) 25 MG tablet Take 1 tablet (25 mg total) daily by mouth. For mood control 01/30/17   Money,  Feliz Beamravis B, FNP  zolpidem (AMBIEN) 10 MG tablet Take 1 tablet (10 mg total) at bedtime as needed by mouth for sleep. 01/29/17 02/28/17  Sanjuana KavaNwoko, Agnes I, NP    Family History History reviewed. No pertinent family history.  Social History Social History   Tobacco Use  . Smoking status: Current Every Day Smoker    Packs/day: 0.50  . Smokeless tobacco: Never Used  Substance Use Topics  . Alcohol use: Yes    Comment: occ  . Drug use: No     Allergies   Celexa [citalopram]; Ziprasidone hcl; Adhesive [tape]; Clindamycin/lincomycin; Lamotrigine; and Latex   Review of Systems Review of Systems  A complete review of systems was obtained and all systems are negative except as noted in the HPI and PMH.   Physical Exam Updated Vital Signs BP 118/88   Pulse 82   Temp 98.2 F (36.8 C) (Oral)   Resp 14   Ht 5\' 5"  (1.651 m)   Wt 93 kg (205 lb)   LMP 01/05/2017   SpO2 98%   BMI 34.11 kg/m   Physical Exam  Constitutional: She is oriented to person, place, and time. She appears well-developed and well-nourished. No distress.  HENT:  Head: Normocephalic and atraumatic.  Mouth/Throat: Oropharynx is clear and moist.  Eyes: Conjunctivae and EOM are normal. Pupils are equal, round, and reactive to light.  No TTP of maxillary or frontal sinuses  No TTP or induration of temporal arteries bilaterally  Neck: Normal range of motion. Neck supple. No JVD present. No tracheal deviation present.  FROM to C-spine. Pt can touch chin to chest without discomfort. No TTP of midline cervical spine.   Cardiovascular: Normal rate, regular rhythm and intact distal pulses.  Radial pulse equal bilaterally  Pulmonary/Chest: Effort normal and breath sounds normal. No stridor. No respiratory distress. She has no wheezes. She has no rales. She exhibits no tenderness.  Abdominal: Soft. Bowel sounds are normal. She exhibits no distension and no mass. There is no tenderness. There is no rebound and no guarding.    Musculoskeletal: Normal range of motion. She exhibits no edema or tenderness.  No calf asymmetry, superficial collaterals, palpable cords, edema, Homans sign negative bilaterally.    Neurological: She is alert and oriented to person, place, and time. No cranial nerve deficit.  II-Visual fields grossly intact. III/IV/VI-Extraocular movements intact.  Pupils reactive bilaterally. V/VII-Smile symmetric, equal eyebrow raise,  facial sensation intact VIII- Hearing grossly intact IX/X-Normal gag XI-bilateral shoulder shrug XII-midline tongue extension Motor: 5/5 bilaterally with normal tone and bulk Cerebellar: Normal finger-to-nose  and normal heel-to-shin test.   Romberg negative Ambulates with a coordinated gait  Patient keeps repeating "help me help me help me"   Skin: Skin is warm. She is not diaphoretic.  Psychiatric: She has a normal mood and affect.  Nursing note and vitals reviewed.    ED Treatments / Results  Labs (all labs ordered are listed, but only abnormal results are displayed) Labs Reviewed  CBC WITH DIFFERENTIAL/PLATELET - Abnormal; Notable for the following components:      Result Value   WBC 11.0 (*)    Neutro Abs 8.4 (*)    All other components within normal limits  RAPID URINE DRUG SCREEN, HOSP PERFORMED - Abnormal; Notable for the following components:   Opiates POSITIVE (*)    Benzodiazepines POSITIVE (*)    All other components within normal limits  URINALYSIS, ROUTINE W REFLEX MICROSCOPIC  COMPREHENSIVE METABOLIC PANEL  LIPASE, BLOOD  ETHANOL  AMMONIA  PREGNANCY, URINE  TROPONIN I  OCCULT BLOOD X 1 CARD TO LAB, STOOL    EKG  EKG Interpretation None       Radiology No results found.  Procedures Procedures (including critical care time)  Medications Ordered in ED Medications  sodium chloride 0.9 % bolus 1,000 mL (0 mLs Intravenous Stopped 02/08/17 1623)  famotidine (PEPCID) IVPB 20 mg premix (0 mg Intravenous Stopped 02/08/17 1725)      Initial Impression / Assessment and Plan / ED Course  I have reviewed the triage vital signs and the nursing notes.  Pertinent labs & imaging results that were available during my care of the patient were reviewed by me and considered in my medical decision making (see chart for details).     Vitals:   02/08/17 1317 02/08/17 1542 02/08/17 1600 02/08/17 1700  BP:  110/77 118/88   Pulse:  84 87 82  Resp:  16 14 14   Temp:      TempSrc:      SpO2:  100% 100% 98%  Weight: 93 kg (205 lb)     Height: 5\' 5"  (1.651 m)       Medications  sodium chloride 0.9 % bolus 1,000 mL (0 mLs Intravenous Stopped 02/08/17 1623)  famotidine (PEPCID) IVPB 20 mg premix (0 mg Intravenous Stopped 02/08/17 1725)    Elaine Mills is 41 y.o. female presenting with sensation of feeling presyncopal and getting pale.  She is having some memory and behavioral issues which are being aggressively investigated by neurology.  There is not appear to be any acute change in her mental status today.  The main complaint at this visit is feeling lightheaded and becoming pale per her husband.  EKG with no acute findings, blood work reassuring, negative guaiac.  Patient bolused in the ED.  She is reporting some epigastric abdominal pain, my abdominal exam is nonsurgical.  Troponin negative.  Guaiac negative.  Repeat abdominal exam remains benign, no indication for imaging given the normal blood work and normal physical exam.  Vital signs reassuring.  I feel that she is safe to follow with her primary care physician, have counseled husband to ask primary care for guidance on tapering off her Ambien and various other psychiatric meds.  Evaluation does not show pathology that would require ongoing emergent intervention or inpatient treatment. Pt is hemodynamically stable and mentating appropriately. Discussed findings and plan with patient/guardian, who agrees with care plan. All questions answered. Return precautions discussed  and outpatient follow up given.   Final Clinical Impressions(s) / ED Diagnoses   Final diagnoses:  Postural dizziness with presyncope    ED Discharge Orders    None       Lynetta Mareisciotta, Mardella Laymanicole, PA-C 02/09/17 [id number]    Gwyneth SproutPlunkett, Whitney,  MD 02/09/17 2046

## 2017-02-08 NOTE — ED Triage Notes (Signed)
Patient denies any Si / Hi but family member shakes his head and states "be truthful" he also reports to the patient as she is trying to talk with this RN about what is going on, other items that the patient does not remember is wrong with her, patient reports that she does not know what the date is but reports " the 21st" when asked LMP. The patient and family member consistently talking back and forth in triage, and will not stop and the patient is very distressed.

## 2017-05-17 ENCOUNTER — Other Ambulatory Visit: Payer: Self-pay

## 2017-05-17 ENCOUNTER — Emergency Department (HOSPITAL_BASED_OUTPATIENT_CLINIC_OR_DEPARTMENT_OTHER): Payer: Medicare Other

## 2017-05-17 ENCOUNTER — Emergency Department (HOSPITAL_BASED_OUTPATIENT_CLINIC_OR_DEPARTMENT_OTHER)
Admission: EM | Admit: 2017-05-17 | Discharge: 2017-05-17 | Disposition: A | Discharge status: Home / Self Care | Payer: Medicare Other | Attending: Emergency Medicine | Admitting: Emergency Medicine

## 2017-05-17 ENCOUNTER — Encounter (HOSPITAL_BASED_OUTPATIENT_CLINIC_OR_DEPARTMENT_OTHER): Payer: Self-pay | Admitting: Emergency Medicine

## 2017-05-17 DIAGNOSIS — J45909 Unspecified asthma, uncomplicated: Secondary | ICD-10-CM | POA: Diagnosis not present

## 2017-05-17 DIAGNOSIS — F319 Bipolar disorder, unspecified: Secondary | ICD-10-CM | POA: Insufficient documentation

## 2017-05-17 DIAGNOSIS — Z79899 Other long term (current) drug therapy: Secondary | ICD-10-CM | POA: Insufficient documentation

## 2017-05-17 DIAGNOSIS — M62838 Other muscle spasm: Secondary | ICD-10-CM | POA: Insufficient documentation

## 2017-05-17 DIAGNOSIS — H8149 Vertigo of central origin, unspecified ear: Secondary | ICD-10-CM | POA: Insufficient documentation

## 2017-05-17 DIAGNOSIS — F17200 Nicotine dependence, unspecified, uncomplicated: Secondary | ICD-10-CM | POA: Diagnosis not present

## 2017-05-17 DIAGNOSIS — F419 Anxiety disorder, unspecified: Secondary | ICD-10-CM | POA: Insufficient documentation

## 2017-05-17 DIAGNOSIS — I10 Essential (primary) hypertension: Secondary | ICD-10-CM | POA: Diagnosis not present

## 2017-05-17 DIAGNOSIS — R42 Dizziness and giddiness: Secondary | ICD-10-CM

## 2017-05-17 DIAGNOSIS — Z91040 Latex allergy status: Secondary | ICD-10-CM | POA: Diagnosis not present

## 2017-05-17 DIAGNOSIS — Z9049 Acquired absence of other specified parts of digestive tract: Secondary | ICD-10-CM | POA: Insufficient documentation

## 2017-05-17 DIAGNOSIS — G4751 Confusional arousals: Secondary | ICD-10-CM | POA: Diagnosis present

## 2017-05-17 LAB — COMPREHENSIVE METABOLIC PANEL
ALK PHOS: 82 U/L (ref 38–126)
ALT: 29 U/L (ref 14–54)
AST: 22 U/L (ref 15–41)
Albumin: 4 g/dL (ref 3.5–5.0)
Anion gap: 10 (ref 5–15)
BUN: 10 mg/dL (ref 6–20)
CALCIUM: 8.9 mg/dL (ref 8.9–10.3)
CHLORIDE: 104 mmol/L (ref 101–111)
CO2: 25 mmol/L (ref 22–32)
CREATININE: 0.8 mg/dL (ref 0.44–1.00)
Glucose, Bld: 110 mg/dL — ABNORMAL HIGH (ref 65–99)
Potassium: 3.4 mmol/L — ABNORMAL LOW (ref 3.5–5.1)
Sodium: 139 mmol/L (ref 135–145)
Total Bilirubin: 0.6 mg/dL (ref 0.3–1.2)
Total Protein: 8.1 g/dL (ref 6.5–8.1)

## 2017-05-17 LAB — CBC WITH DIFFERENTIAL/PLATELET
BASOS ABS: 0 10*3/uL (ref 0.0–0.1)
Basophils Relative: 0 %
Eosinophils Absolute: 0 10*3/uL (ref 0.0–0.7)
Eosinophils Relative: 0 %
HEMATOCRIT: 39.8 % (ref 36.0–46.0)
HEMOGLOBIN: 13.2 g/dL (ref 12.0–15.0)
LYMPHS ABS: 1.9 10*3/uL (ref 0.7–4.0)
LYMPHS PCT: 22 %
MCH: 26.8 pg (ref 26.0–34.0)
MCHC: 33.2 g/dL (ref 30.0–36.0)
MCV: 80.7 fL (ref 78.0–100.0)
Monocytes Absolute: 0.7 10*3/uL (ref 0.1–1.0)
Monocytes Relative: 8 %
NEUTROS ABS: 6.3 10*3/uL (ref 1.7–7.7)
NEUTROS PCT: 70 %
Platelets: 311 10*3/uL (ref 150–400)
RBC: 4.93 MIL/uL (ref 3.87–5.11)
RDW: 13.8 % (ref 11.5–15.5)
WBC: 9 10*3/uL (ref 4.0–10.5)

## 2017-05-17 LAB — PROTIME-INR
INR: 0.97
Prothrombin Time: 12.8 seconds (ref 11.4–15.2)

## 2017-05-17 LAB — TROPONIN I: Troponin I: <0.03 ng/mL (ref <0.03)

## 2017-05-17 LAB — URINALYSIS, MICROSCOPIC (REFLEX)

## 2017-05-17 LAB — URINALYSIS, ROUTINE W REFLEX MICROSCOPIC
Bilirubin Urine: NEGATIVE
Glucose, UA: NEGATIVE mg/dL
Ketones, ur: NEGATIVE mg/dL
NITRITE: NEGATIVE
PROTEIN: NEGATIVE mg/dL
SPECIFIC GRAVITY, URINE: 1.025 (ref 1.005–1.030)
pH: 6 (ref 5.0–8.0)

## 2017-05-17 LAB — RAPID URINE DRUG SCREEN, HOSP PERFORMED
AMPHETAMINES: NOT DETECTED
BARBITURATES: NOT DETECTED
Benzodiazepines: POSITIVE — AB
Cocaine: NOT DETECTED
Opiates: NOT DETECTED
Tetrahydrocannabinol: NOT DETECTED

## 2017-05-17 LAB — PREGNANCY, URINE: Preg Test, Ur: NEGATIVE

## 2017-05-17 LAB — AMMONIA: AMMONIA: 13 umol/L (ref 9–35)

## 2017-05-17 MED ORDER — MECLIZINE HCL 25 MG PO TABS
25.0000 mg | ORAL_TABLET | Freq: Once | ORAL | Status: AC
  Administered 2017-05-17: 25 mg via ORAL
  Filled 2017-05-17: qty 1

## 2017-05-17 MED ORDER — LORAZEPAM 2 MG/ML IJ SOLN
1.0000 mg | Freq: Once | INTRAMUSCULAR | Status: AC
  Administered 2017-05-17: 1 mg via INTRAVENOUS
  Filled 2017-05-17: qty 1

## 2017-05-17 MED ORDER — METHOCARBAMOL 500 MG PO TABS: 500.0000 mg | ORAL_TABLET | Freq: Three times a day (TID) | ORAL | 0 refills | Status: DC | PRN

## 2017-05-17 MED ORDER — SODIUM CHLORIDE 0.9 % IV BOLUS (SEPSIS)
1000.0000 mL | Freq: Once | INTRAVENOUS | Status: AC
  Administered 2017-05-17: 1000 mL via INTRAVENOUS

## 2017-05-17 MED ORDER — MECLIZINE HCL 25 MG PO TABS: 25.0000 mg | ORAL_TABLET | Freq: Three times a day (TID) | ORAL | 0 refills | Status: DC | PRN

## 2017-05-17 NOTE — ED Triage Notes (Signed)
Patient states that she has had confusion, SOB and dizzy feeling for the last 2 -3 days. She called EMS last night and was seen chose not to come because everything was "fine" - Family members states that they were told that her "ammoina level" was high by her neurologist. Patient is tachapnic and very anxious in triage, but reports this not like her panic attacks. The patient also stopped xanax 2-3 days ago. She was started on valium

## 2017-05-17 NOTE — ED Provider Notes (Signed)
[facility] EMERGENCY DEPARTMENT Provider Note   CSN: [id number] Arrival date & time: 05/17/17  1520     History   Chief Complaint Chief Complaint  Patient presents with  . Altered Mental Status    HPI Elaine Mills is a 42 y.o. female.  Elaine Mills is a 42 y.o. Female with a history of lupus, fibromyalgia, migraines, asthma, hypertension, bipolar 1 disorder, who presents to the ED for evaluation of confusion dizziness and room spinning sensation as well as chest pain and shortness of breath.  Patient reports episodes of dizziness for the past 3 weeks but she feels this is been worsening over the past 2-3 days and last night she had an "fainting spell" after which EMS was called but patient elected not to be transported to the hospital for further evaluation because "everything was fine".  Patient reports she not been taking anything at home to treat the symptoms just been trying to sit very still and not move much.  Patient denies any associated headaches, she reports some occasional blurry vision, no loss of vision or eye pain.  Occasional nausea but no emesis.  Patient associates dizziness with pain in the back of her neck that feels like muscle tightness and spasm.  Patient denies any ear pain or hearing change.  Patient reports yesterday she began experiencing some intermittent central chest pain which she describes as shortness of breath, this is been happening on and off today as well.  Patient denies associated shortness of breath or cough, no lower extremity swelling or tenderness no recent surgeries, no long distance travel, no history of DVT or PE or clotting disorder.  Nothing prior to arrival.  No other aggravating or alleviating factors. She called her neurologist regarding all of these symptoms today, patient also reports she had an elevated ammonia level of 73 about 3 weeks ago according to neurologist, and her neurologist recommended she come to the emergency  department for evaluation.  Patient denies history of cirrhosis, reports she previously had a transient elevation in her liver enzymes, but reports negative hepatitis testing.  Patient is not experiencing any abdominal pain, vomiting or fevers, no jaundice..      Past Medical History:  Diagnosis Date  . Anemia   . Anxiety   . Arthritis   . Asthma   . Bipolar 1 disorder (HCC)   . Borderline personality disorder (HCC)   . Chronic back pain   . Chronic knee pain   . Fibromyalgia   . Hiatal hernia   . Hypertension   . IBS (irritable bowel syndrome)   . Lupus   . Migraines   . Neck pain   . Obstructive sleep apnea   . Ovarian torsion   . Panic attack   . PTSD (post-traumatic stress disorder)   . Sciatica   . Scoliosis     Patient Active Problem List   Diagnosis Date Noted  . MDD (major depressive disorder) 01/24/2017  . Borderline personality disorder (HCC) 01/24/2017  . Bipolar 2 disorder (HCC) 01/23/2017  . Bipolar 1 disorder, depressed, severe (HCC) 01/23/2017  . OVARIAN CYST 11/10/2008  . COUGH 11/10/2008  . BIPOLAR DISORDER UNSPECIFIED 11/09/2008  . ASTHMA 11/09/2008  . GASTROESOPHAGEAL REFLUX DISEASE 11/09/2008    Past Surgical History:  Procedure Laterality Date  . BREAST SURGERY    . CHOLECYSTECTOMY    . HERNIA REPAIR    . KNEE SURGERY Left     OB History    No data available  Home Medications    Prior to Admission medications   Medication Sig Start Date End Date Taking? Authorizing Provider  diazepam (VALIUM) 5 MG tablet Take 5 mg by mouth every 6 (six) hours as needed for anxiety.   Yes [provider]  albuterol (PROVENTIL HFA;VENTOLIN HFA) 108 (90 BASE) MCG/ACT inhaler Inhale 1-2 puffs into the lungs every 6 (six) hours as needed for wheezing or shortness of breath. 04/03/13   Gerhard Munch, MD  cholecalciferol (VITAMIN D) 1000 units tablet Take 1 tablet (1,000 Units total) daily by mouth. For low vitamin D 01/29/17   Money,  Feliz Beam B, FNP  fluticasone furoate-vilanterol (BREO ELLIPTA) 100-25 MCG/INH AEPB Inhale 1 puff into the lungs daily.    [provider]  hydrOXYzine (ATARAX/VISTARIL) 25 MG tablet Take 1 tablet (25 mg total) 3 (three) times daily as needed by mouth for anxiety. 01/29/17   Money, Gerlene Burdock, FNP  meclizine (ANTIVERT) 25 MG tablet Take 1 tablet (25 mg total) by mouth 3 (three) times daily as needed for dizziness. 05/17/17   Rolland Porter, MD  methocarbamol (ROBAXIN) 500 MG tablet Take 1 tablet (500 mg total) by mouth 3 (three) times daily between meals as needed. 05/17/17   Rolland Porter, MD  metoCLOPramide (REGLAN) 10 MG tablet Take 10 mg by mouth 4 (four) times daily.    [provider]  metoprolol succinate (TOPROL-XL) 50 MG 24 hr tablet Take 50 mg by mouth daily. Take with or immediately following a meal.    [provider]  omeprazole (PRILOSEC) 40 MG capsule Take 40 mg by mouth daily before breakfast.    [provider]  QUEtiapine (SEROQUEL) 300 MG tablet Take 1 tablet (300 mg total) at bedtime by mouth. For mood control 01/29/17   Money, Feliz Beam B, FNP  rosuvastatin (CRESTOR) 10 MG tablet Take 10 mg by mouth at bedtime.     [provider]  sertraline (ZOLOFT) 25 MG tablet Take 1 tablet (25 mg total) daily by mouth. For mood control 01/30/17   Money, Feliz Beam B, FNP  zolpidem (AMBIEN) 10 MG tablet Take 1 tablet (10 mg total) at bedtime as needed by mouth for sleep. 01/29/17 02/28/17  Sanjuana Kava, NP    Family History History reviewed. No pertinent family history.  Social History Social History   Tobacco Use  . Smoking status: Current Every Day Smoker    Packs/day: 0.50  . Smokeless tobacco: Never Used  Substance Use Topics  . Alcohol use: Yes    Comment: occ  . Drug use: No     Allergies   Celexa [citalopram]; Ziprasidone hcl; Adhesive [tape]; Clindamycin/lincomycin; Lamotrigine; and Latex   Review of Systems Review of Systems    Constitutional: Negative for chills and fever.  HENT: Negative for congestion, rhinorrhea and sore throat.   Eyes: Positive for visual disturbance. Negative for photophobia, pain and discharge.  Respiratory: Positive for shortness of breath. Negative for cough, chest tightness and wheezing.   Cardiovascular: Positive for chest pain. Negative for palpitations and leg swelling.  Gastrointestinal: Positive for nausea. Negative for abdominal pain, blood in stool, diarrhea and vomiting.  Genitourinary: Negative for dysuria and frequency.  Musculoskeletal: Positive for neck pain. Negative for arthralgias, myalgias and neck stiffness.  Skin: Negative for color change, pallor and rash.  Neurological: Positive for dizziness and syncope. Negative for facial asymmetry, speech difficulty, weakness, light-headedness, numbness and headaches.     Physical Exam Updated Vital Signs BP 120/86   Pulse 88  Temp 98 F (36.7 C)   Resp 18   Ht 5\' 5"  (1.651 m)   Wt 99.8 kg (220 lb)   LMP 05/03/2017   SpO2 100%   BMI 36.61 kg/m   Physical Exam  Constitutional: She appears well-developed and well-nourished. No distress.  HENT:  Head: Normocephalic and atraumatic.  Mouth/Throat: Oropharynx is clear and moist.  Eyes: EOM are normal. Pupils are equal, round, and reactive to light. Right eye exhibits no discharge. Left eye exhibits no discharge.  Neck: Normal range of motion. Neck supple.  Mild tenderness across neck, no focal midline tenderness  Cardiovascular: Normal rate, regular rhythm, normal heart sounds and intact distal pulses.  Pulmonary/Chest: Effort normal and breath sounds normal. No respiratory distress. She has no wheezes. She has no rales.  Respirations equal and unlabored, patient able to speak in full sentences, lungs clear to auscultation bilaterally  Abdominal: Soft. Bowel sounds are normal. She exhibits no distension and no mass. There is no tenderness. There is no guarding.   Musculoskeletal: She exhibits no edema or deformity.  Neurological: She is alert. Coordination normal.  Speech is clear, able to follow commands CN III-XII intact Normal strength in upper and lower extremities bilaterally including dorsiflexion and plantar flexion, strong and equal grip strength Sensation normal to light and sharp touch Moves extremities without ataxia, coordination intact Normal finger to nose and rapid alternating movements No pronator drift  Skin: Skin is warm and dry. Capillary refill takes less than 2 seconds. She is not diaphoretic.  Nursing note and vitals reviewed.    ED Treatments / Results  Labs (all labs ordered are listed, but only abnormal results are displayed) Labs Reviewed  URINALYSIS, ROUTINE W REFLEX MICROSCOPIC - Abnormal; Notable for the following components:      Result Value   APPearance CLOUDY (*)    Hgb urine dipstick TRACE (*)    Leukocytes, UA MODERATE (*)    All other components within normal limits  COMPREHENSIVE METABOLIC PANEL - Abnormal; Notable for the following components:   Potassium 3.4 (*)    Glucose, Bld 110 (*)    All other components within normal limits  RAPID URINE DRUG SCREEN, HOSP PERFORMED - Abnormal; Notable for the following components:   Benzodiazepines POSITIVE (*)    All other components within normal limits  URINALYSIS, MICROSCOPIC (REFLEX) - Abnormal; Notable for the following components:   Bacteria, UA MANY (*)    Squamous Epithelial / LPF TOO NUMEROUS TO COUNT (*)    All other components within normal limits  CBC WITH DIFFERENTIAL/PLATELET  AMMONIA  PREGNANCY, URINE  TROPONIN I  PROTIME-INR    EKG  EKG Interpretation None       Radiology Dg Chest 2 View  Result Date: 05/17/2017 CLINICAL DATA:  Patient with confusion.  Shortness breath. EXAM: CHEST  2 VIEW COMPARISON:  Chest radiograph 04/16/2017. FINDINGS: Stable cardiac and mediastinal contours. No consolidative pulmonary opacities. No pleural  effusion or pneumothorax. Thoracic spine degenerative changes. Cholecystectomy clips. IMPRESSION: No acute cardiopulmonary process. Electronically Signed   By: Annia Belt M.D.   On: 05/17/2017 16:30   Ct Head Wo Contrast  Result Date: 05/17/2017 CLINICAL DATA:  Confusion, shortness of breath.  Dizziness. EXAM: CT HEAD WITHOUT CONTRAST TECHNIQUE: Contiguous axial images were obtained from the base of the skull through the vertex without intravenous contrast. COMPARISON:  Brain CT 11/05/2016. FINDINGS: Brain: The ventricles and sulci are appropriate for patient's age. No evidence for acute cortically based infarct, intracranial hemorrhage,  mass lesion or mass-effect. Vascular: Unremarkable. Skull: Intact. Sinuses/Orbits: Paranasal sinuses are unremarkable. Mastoid air cells are well aerated. Other: None. IMPRESSION: No acute intracranial process. Electronically Signed   By: Annia Beltrew  Davis M.D.   On: 05/17/2017 17:11    Procedures Procedures (including critical care time)  Medications Ordered in ED Medications  sodium chloride 0.9 % bolus 1,000 mL (0 mLs Intravenous Stopped 05/17/17 1751)  meclizine (ANTIVERT) tablet 25 mg (25 mg Oral Given 05/17/17 1712)  LORazepam (ATIVAN) injection 1 mg (1 mg Intravenous Given 05/17/17 1712)     Initial Impression / Assessment and Plan / ED Course  I have reviewed the triage vital signs and the nursing notes.  Pertinent labs & imaging results that were available during my care of the patient were reviewed by me and considered in my medical decision making (see chart for details).  Pt presents with multiple complaints, dizziness, confusion CP and SOB. Pt initially tachycardic and tachypneic in triage, but has calmed down and has normal vitals and is in NAD on my evaluation. Normal neurologic exam, lungs clear. Pt does not appear confused or altered during my evaluation, A&Ox3 entire hx provided by pt. Will get general labs, UA, UDS, ammonia, INR, trop, EKG, CXR and CT  head. Will give fluids, meclizine and ativan and reevaluate the patient.  Labs reassuring, no leukocytosis, normal hgb, K+ 3.4, no other electrolyte derangements, normal kidney and liver function, Ammonia 17, INR normal, Troponin negative, EKG without concerning changes, doubt ACS. UA appears contaminated, w/ TNTC squams, unlikely infected, pt denies urinary sx. UDS + for benzos which patient is prescribed.  CXR shows no evidence of active cardiopulmonary disease. CT head w/o acute intracranial abnormalities, pt with normal MRI 6 months ago, doubt dizziness related to posterior circulation stroke since they have been present > 3 wks and normal neuro exam today.  Pt reports improvement in symptoms after medications, no longer having CP or SOB. Vitals have remained stable, discussed with Dr. Fayrene FearingJames who evaluated pt as well, feels symptoms are likely due to cervical vertigo, will discharge with meclizine and robaxin and close PCP and neurology follow up. Return precautions discussed.  Patient discussed with Dr. Fayrene FearingJames, who saw patient as well and agrees with plan.  Final Clinical Impressions(s) / ED Diagnoses   Final diagnoses:  Cervical vertigo  Cervical paraspinal muscle spasm    ED Discharge Orders        Ordered    meclizine (ANTIVERT) 25 MG tablet  3 times daily PRN     05/17/17 1748    methocarbamol (ROBAXIN) 500 MG tablet  3 times daily between meals PRN     05/17/17 1748       Dartha LodgeFord, Kimiko Common N, PA-C 05/17/17 2205    Rolland PorterJames, Mark, MD 05/17/17 [phone]

## 2017-05-17 NOTE — ED Provider Notes (Signed)
Patient discussed with Elaine Forde PA-C.  Elaine Mills and examined by myself.  Her main concern today is episodes of vertigo.  These seem to be related to her cervical muscle spasm, and perhaps cervical vertigo.  Ultimately am planning treatment with Antivert, and Robaxin.  She is well oxygenated, afebrile, reassuring labs, normal ammonia.  Normal x-ray.  Plan primary care follow-up.  Continuation of other medications as prescribed.  She does request refill of Xanax or Ativan.  I have declined this, and deferred her back to her primary care physician   Elaine Mills, Elaine Verga, MD 05/17/17 [id number]

## 2017-05-17 NOTE — ED Notes (Signed)
Pt in X ray

## 2017-05-17 NOTE — Discharge Instructions (Signed)
Antivert/meclizine 3 times per day for your vertigo until you have 24 hours of no symptoms, then you may stop.  Baxton, muscle relaxant, for your neck muscle strain

## 2017-06-27 ENCOUNTER — Encounter: Payer: Self-pay | Admitting: Cardiovascular Disease

## 2017-06-27 ENCOUNTER — Ambulatory Visit (INDEPENDENT_AMBULATORY_CARE_PROVIDER_SITE_OTHER): Payer: Medicare Other | Admitting: Cardiovascular Disease

## 2017-06-27 ENCOUNTER — Encounter (INDEPENDENT_AMBULATORY_CARE_PROVIDER_SITE_OTHER): Payer: Self-pay

## 2017-06-27 VITALS — BP 123/85 | HR 104 | Ht 65.0 in | Wt 223.0 lb

## 2017-06-27 DIAGNOSIS — R0789 Other chest pain: Secondary | ICD-10-CM | POA: Diagnosis not present

## 2017-06-27 LAB — BASIC METABOLIC PANEL
BUN/Creatinine Ratio: 16 (ref 9–23)
BUN: 13 mg/dL (ref 6–24)
CALCIUM: 9.3 mg/dL (ref 8.7–10.2)
CO2: 22 mmol/L (ref 20–29)
Chloride: 99 mmol/L (ref 96–106)
Creatinine, Ser: 0.8 mg/dL (ref 0.57–1.00)
GFR calc non Af Amer: 93 mL/min/{1.73_m2} (ref >59)
GFR, EST AFRICAN AMERICAN: 107 mL/min/{1.73_m2} (ref >59)
GLUCOSE: 95 mg/dL (ref 65–99)
POTASSIUM: 4.2 mmol/L (ref 3.5–5.2)
Sodium: 137 mmol/L (ref 134–144)

## 2017-06-27 NOTE — Patient Instructions (Signed)
Medication Instructions: Your physician recommends that you continue on your current medications as directed. Please refer to the Current Medication list given to you today.  Labwork: Your physician recommends that you return for lab work: BMET--today   Testing/Procedures: Please arrive at the [facility] main entrance [facility] at xx:xx AM (30-45 minutes prior to test start time)  [facility] [address] [place], [place] [address] [phone]  Proceed to the [facility] Radiology Department (First Floor).  Please follow these instructions carefully (unless otherwise directed):  On the Night Before the Test: . Drink plenty of water. . Do not consume any caffeinated/decaffeinated beverages or chocolate 12 hours prior to your test. . Do not take any antihistamines 12 hours prior to your test.  On the Day of the Test: . Drink plenty of water. Do not drink any water within one hour of the test. . Do not eat any food 4 hours prior to the test. . You may take your regular medications prior to the test.  After the Test: . Drink plenty of water. . After receiving IV contrast, you may experience a mild flushed feeling. This is normal. . On occasion, you may experience a mild rash up to 24 hours after the test. This is not dangerous. If this occurs, you can take Benadryl 25 mg and increase your fluid intake. . If you experience trouble breathing, this can be serious. If it is severe call 911 IMMEDIATELY. If it is mild, please call our office. . If you take any of these medications: Glipizide/Metformin, Avandament, Glucavance, please do not take 48 hours after completing test.   Follow-Up: Your physician recommends that you schedule a follow-up appointment as needed with Dr. Allyson SabalBerry. We will call you with your results.

## 2017-06-27 NOTE — Progress Notes (Signed)
06/27/2017 Elaine Mills   [birth year]  [id number]  Primary Physician [place], Alton RevereBarry D, MD Primary Cardiologist: Runell GessJonathan J Carsyn Taubman MD Nicholes CalamityFACP, FACC, FAHA, [place]  HPI:  Elaine SimmondsRachael Mills is a 42 y.o. moderately overweight married Caucasian female mother of one child is accompanied by her husband [name]. She was [facility] because of atypical chest pain. Her risk factors include 20-pack-years of tobacco abuse currently smoking one pack per day although trying to quit, due to hypertension, hyperlipidemia and family history with father who had bypass surgery. She's never had a heart attack or stroke. She does have fibromyalgia, decreased memory, chronic chronic dizziness headaches and bipolar disorder. She is disabled because of these problems. Her chronic chest pain for the last 4 months. The pain occurs daily and last for hours at a time not necessarily related to activity. She did have an echo that was normal and a Myoview stress test that showed inferior scar with an EF of 37%.    Current Meds  Medication Sig  . albuterol (PROVENTIL HFA;VENTOLIN HFA) 108 (90 BASE) MCG/ACT inhaler Inhale 1-2 puffs into the lungs every 6 (six) hours as needed for wheezing or shortness of breath.  . fluticasone furoate-vilanterol (BREO ELLIPTA) 100-25 MCG/INH AEPB Inhale 1 puff into the lungs daily.  . hydrochlorothiazide (HYDRODIURIL) 25 MG tablet Take 25 mg by mouth daily.  [name]. LORazepam (ATIVAN) 0.5 MG tablet Take 0.5 mg by mouth 3 (three) times daily.  . metoCLOPramide (REGLAN) 10 MG tablet Take 10 mg by mouth 4 (four) times daily.  . metoprolol succinate (TOPROL-XL) 50 MG 24 hr tablet Take 50 mg by mouth daily. Take with or immediately following a meal.  . nitroGLYCERIN (NITROSTAT) 0.4 MG SL tablet Place 1 tablet under the tongue as directed.  [name]. omeprazole (PRILOSEC) 40 MG capsule Take 40 mg by mouth daily before breakfast.  . QUEtiapine (SEROQUEL) 300 MG  tablet Take by mouth at bedtime. Pt takes 1 tablet and a half (450mg ) daily at bedtime  . rosuvastatin (CRESTOR) 10 MG tablet Take 10 mg by mouth at bedtime.   [name]. zolpidem (AMBIEN) 10 MG tablet Take by mouth at bedtime as needed for sleep. Pt takes 1 tablet and a half (15 mg) daily     Allergies  Allergen Reactions  . Celexa [Citalopram] Anaphylaxis  . Topiramate Other (See Comments)    dizziness  . Ziprasidone Hcl Nausea And Vomiting  . Adhesive [Tape] Rash  . Clindamycin/Lincomycin Rash  . Lamotrigine Rash  . Latex Rash    No powdered gloves!!    Social History   Socioeconomic History  . Marital status: Married    Spouse name: Not on file  . Number of children: Not on file  . Years of education: Not on file  . Highest education level: Not on file  Occupational History  . Not on file  Social Needs  . Financial resource strain: Not on file  . Food insecurity:    Worry: Not on file    Inability: Not on file  . Transportation needs:    Medical: Not on file    Non-medical: Not on file  Tobacco Use  . Smoking status: Current Every Day Smoker    Packs/day: 0.50  . Smokeless tobacco: Never Used  Substance and Sexual Activity  . Alcohol use: Yes    Comment: occ  . Drug use: No  . Sexual activity: Not Currently    Birth control/protection: None  Lifestyle  .  Physical activity:    Days per week: Not on file    Minutes per session: Not on file  . Stress: Not on file  Relationships  . Social connections:    Talks on phone: Not on file    Gets together: Not on file    Attends religious service: Not on file    Active member of club or organization: Not on file    Attends meetings of clubs or organizations: Not on file    Relationship status: Not on file  . Intimate partner violence:    Fear of current or ex partner: Not on file    Emotionally abused: Not on file    Physically abused: Not on file    Forced sexual activity: Not on file  Other Topics Concern  . Not on  file  Social History Narrative  . Not on file     Review of Systems: General: negative for chills, fever, night sweats or weight changes.  Cardiovascular: negative for chest pain, dyspnea on exertion, edema, orthopnea, palpitations, paroxysmal nocturnal dyspnea or shortness of breath Dermatological: negative for rash Respiratory: negative for cough or wheezing Urologic: negative for hematuria Abdominal: negative for nausea, vomiting, diarrhea, bright red blood per rectum, melena, or hematemesis Neurologic: negative for visual changes, syncope, or dizziness All other systems reviewed and are otherwise negative except as noted above.    Blood pressure 123/85, pulse (!) 104, height 5\' 5"  (1.651 m), weight 223 lb (101.2 kg).  General appearance: alert and no distress Neck: no adenopathy, no carotid bruit, no JVD, supple, symmetrical, trachea midline and thyroid not enlarged, symmetric, no tenderness/mass/nodules Lungs: clear to auscultation bilaterally Heart: regular rate and rhythm, S1, S2 normal, no murmur, click, rub or gallop Extremities: extremities normal, atraumatic, no cyanosis or edema Pulses: 2+ and symmetric Skin: Skin color, texture, turgor normal. No rashes or lesions Neurologic: Alert and oriented X 3, normal strength and tone. Normal symmetric reflexes. Normal coordination and gait  EKG sinus tachycardia at 104 without ST or T-wave changes. I personally reviewed this EKG.  ASSESSMENT AND PLAN:   Atypical chest pain Elaine Mills was referred to me by Burlene Arnt [facility] for atypical chest pain. She is a 42 year old mildly overweight female through factors include tobacco abuse, hyperlipidemia, hypertension and family history. She does have borderline personality disorder, bipolar disorder, fibromyalgia. She's had pain the last 4 months that occurs daily lasting all day both at rest and with exertion. Recent echo showed normal LV function and Myoview stress test showed inferior  scar with an EF of 37%. There is clearly a disparity in her noninvasive diagnostic information. I'm not convinced that she has ischemic heart disease. I am going to perform a coronary CTA to further evaluate.      Runell Gess MD FACP,FACC,FAHA, [facility] 06/27/2017 8:43 AM

## 2017-06-27 NOTE — Assessment & Plan Note (Signed)
Ms. Thad RangerReynolds was [facility] to me [facility] atypical chest pain. She is a 42 year old mildly overweight female through factors include tobacco abuse, hyperlipidemia, hypertension and family history. She does have borderline personality disorder, bipolar disorder, fibromyalgia. She's had pain the last 4 months that occurs daily lasting all day both at rest and with exertion. 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 There is clearly a disparity in her noninvasive diagnostic information. I'm not convinced that she has ischemic heart disease. I am going to perform a coronary CTA to further evaluate.

## 2017-06-27 NOTE — Addendum Note (Signed)
Addended by: Evans LanceSTOVER, Torin Whisner W on: 06/27/2017 09:03 AM   Modules accepted: Orders

## 2017-06-28 LAB — HEMOGLOBIN A1C
ESTIMATED AVERAGE GLUCOSE: 114 mg/dL
Hgb A1c MFr Bld: 5.6 % (ref 4.8–5.6)

## 2017-06-30 ENCOUNTER — Telehealth: Payer: Self-pay | Admitting: Cardiovascular Disease

## 2017-06-30 NOTE — Telephone Encounter (Signed)
Follow up  ° ° °Patient is returning call in reference to labs. Please call to discuss.  °

## 2017-06-30 NOTE — Telephone Encounter (Signed)
Patient made aware of results and verbalized her understanding.  Notes recorded by Runell GessBerry, Jonathan J, MD on 06/27/2017 at 5:12 PM EDT Labs okay for CTA

## 2017-06-30 NOTE — Telephone Encounter (Signed)
Left message to call back  

## 2017-06-30 NOTE — Telephone Encounter (Signed)
New Message:      Pt is calling to get results of labs.

## 2017-07-18 ENCOUNTER — Ambulatory Visit (HOSPITAL_COMMUNITY)
Admission: RE | Admit: 2017-07-18 | Discharge: 2017-07-18 | Disposition: A | Discharge status: Home / Self Care | Payer: Medicare Other | Source: Ambulatory Visit | Attending: Cardiovascular Disease | Admitting: Cardiovascular Disease

## 2017-07-18 ENCOUNTER — Ambulatory Visit (HOSPITAL_COMMUNITY): Payer: Medicare Other

## 2017-07-18 ENCOUNTER — Encounter (HOSPITAL_COMMUNITY): Payer: Self-pay

## 2017-07-18 DIAGNOSIS — R0789 Other chest pain: Secondary | ICD-10-CM

## 2017-07-18 MED ORDER — METOPROLOL TARTRATE 5 MG/5ML IV SOLN
INTRAVENOUS | Status: AC
  Administered 2017-07-18: 5 mg via INTRAVENOUS
  Filled 2017-07-18: qty 10

## 2017-07-18 MED ORDER — METOPROLOL TARTRATE 5 MG/5ML IV SOLN
5.0000 mg | INTRAVENOUS | Status: DC | PRN
  Administered 2017-07-18: 5 mg via INTRAVENOUS
  Filled 2017-07-18: qty 5

## 2017-07-18 MED ORDER — NITROGLYCERIN 0.4 MG SL SUBL
SUBLINGUAL_TABLET | SUBLINGUAL | Status: AC
  Filled 2017-07-18: qty 2

## 2017-07-18 MED ORDER — NITROGLYCERIN 0.4 MG SL SUBL
0.8000 mg | SUBLINGUAL_TABLET | Freq: Once | SUBLINGUAL | Status: DC
  Filled 2017-07-18: qty 25

## 2017-07-18 NOTE — Progress Notes (Addendum)
Patient complaining of dizziness and chest pain during initial assessment. Patient and husband state that patient has been diagnosed with Vertigo and chest pain symptoms are not new. Patient is here for CT heart scan to evaluate chest pain. BP 115/84 HR 102. Dr. Shirlee Latch notified of vitals and indication for test. Order given for 5 mg of Lopressor to reduce heart rate to goal of 60 as blood pressure tolerates. After 5 mg of Lopressor, patient now complaining of nausea, continued chest pain and dizziness. Blood pressure 120/80 HR 85. No additional doses of Lopressor given per request of husband. Dr. Shirlee Latch paged and informed of situation. This RN offered to take patient to emergency room but patient and husband declined. Patient given a snack and beverage and now states she feels better, vss. Patient husband states if symptoms become worse he will seek additional medical care.

## 2017-07-21 ENCOUNTER — Telehealth: Payer: Self-pay | Admitting: Cardiovascular Disease

## 2017-07-21 NOTE — Telephone Encounter (Signed)
Spoke with Pt was states she went on 5/3 to have a coronary CTA. She reports she was given IV metoprolol but they were unable to get her HR down in order to perform. Pt calling to see what Dr. Allyson Sabal recommends next because she would like to have some test performed for answer to correspond with her symptoms. Routed to MD

## 2017-07-21 NOTE — Telephone Encounter (Signed)
New Message:     Pt is wanting to know what she needs to do next due to not being able to do the CT Scan (5/3)due to her heart rate being up.

## 2017-07-23 NOTE — Telephone Encounter (Signed)
New message:      Pt is calling back to see what her next move should be

## 2017-07-23 NOTE — Telephone Encounter (Signed)
Pt calling back she knows she cannot complete the Cardiac CT and wants to know what next options are, is it a cath. Told pt that MD will be back in the office Friday and will try to get him to review at that time. Pt verbalized understanding, no additional questions at this time.

## 2017-07-25 NOTE — Telephone Encounter (Signed)
Can you find out why she can't complete the coronary CTA? Sounds strange to me. Cath is th eonly other option which I'd like to avoid

## 2017-07-25 NOTE — Telephone Encounter (Signed)
Parke Poisson, RN 4 days ago      Spoke with Pt was states she went on 5/3 to have a coronary CTA. She reports she was given IV metoprolol but they were unable to get her HR down in order to perform. Pt calling to see what Dr. Allyson Sabal recommends next because she would like to have some test performed for answer to correspond with her symptoms. Routed to MD

## 2017-07-25 NOTE — Telephone Encounter (Signed)
I would rather not perform cardiac catheterization on her.  Can we start her on p.o. metoprolol and see if we can regulate her heart rate low enough to perform the CTA

## 2017-07-28 NOTE — Telephone Encounter (Signed)
Patient's HR was 104bpm at last visit. Per chart review, she is on toprol  QD  LM to discuss meds with patient and to see if she checks VS to determine what her BP & HR are running at home

## 2017-07-28 NOTE — Telephone Encounter (Signed)
Patient returned call. She is currently on toprol  QD. She reports her HR is consistently >100bpm and can get up to 145bpm. She reports her BP is in the 110s systolic. She states she has continued chest pain and wants to know if Dr. Allyson Sabal can order another test. Explained that MD will have to decide what testing is appropriate - she had outpatient echo/stress test @ [facility] in Jan 2019.   Routed to MD

## 2017-07-29 NOTE — Telephone Encounter (Signed)
Per chart review, patient was unable to have this test done previously as they could not get her HR low enough for the test.

## 2017-07-29 NOTE — Telephone Encounter (Signed)
Please schedule a coronary CTA

## 2017-07-30 NOTE — Telephone Encounter (Signed)
Follow Up:   Pt calling to find out what test she can take now,since she was unable to take the Coronary CT? She says she is still having chest pain and pressure in her chest.

## 2017-07-30 NOTE — Telephone Encounter (Signed)
She needs to see me back in follow-up

## 2017-07-30 NOTE — Telephone Encounter (Signed)
Spoke with pt, advised that dr berry wants her to see the pharm md to help adjust the beta blocker to get her heart rate under control for the CT. Her husband got on the phone and was unable to make appt due to conflicts with his schedule, he is her only transportation. The patient got on the phone and demanded to have another test done besides the CT scan, she reports they will not be able to get her heart rate under control. Per dr berry, there is no other testing besides a cath and she does not need that.

## 2017-07-30 NOTE — Telephone Encounter (Signed)
Patient called our office demanding that her provider do "other test" on her because she knows her HR will not decrease with any medications. Pt demands that a heart cath is what Dr. Allyson Sabal needs to do for her, as no medications will help her. Pt educated that Dr. Allyson Sabal will not resort to an invasive procedure such as heart cath without trying all the least invasive options first. His current recommendations are for her to established in the [facility] to help get her HR to a manageable level so the Cardiac CT can be retried. Pt is very reluctant but agrees to an appt in the [facility] on 5/21 :30pm. She is educated that this is the start of the process of managing her HR so she can the Cardiac CT. Pt verbalized understanding.

## 2017-07-31 NOTE — Telephone Encounter (Signed)
Thx for taking care of this.

## 2017-08-01 ENCOUNTER — Telehealth: Payer: Self-pay | Admitting: Cardiovascular Disease

## 2017-08-01 NOTE — Telephone Encounter (Signed)
New Message:         Judeth Cornfield needs to know if pt's appointment for 08-05-17 is an emergency appointment or something that can be rescheduled for later? She needs to know this because transportation is supposed too be notified of appointment three days prior to appointment.appiis

## 2017-08-01 NOTE — Telephone Encounter (Signed)
Spoke with [employer] transportation, advised the appointment is an emergency. All other questions answered.

## 2017-08-05 ENCOUNTER — Ambulatory Visit: Payer: Medicare Other

## 2019-03-16 IMAGING — MR MR HEAD WO/W CM
10 of 12 series · 35 of 48 positions shown · IV contrast (Yes   MULTIHANCE)
Comparison: 10/14/2016 at [REDACTED]

CLINICAL DATA: Irrational behavior. Confusion and memory loss.
Headaches for a few months.

EXAM:
MRI HEAD WITHOUT AND WITH CONTRAST
TECHNIQUE: Multiplanar, multiecho pulse sequences of the brain and surrounding
structures were obtained without and with intravenous contrast.
CONTRAST:  20cc MULTIHANCE IV

[Series 3: DWI · axial · 3.0mm · 1.09mm/px · z∈[-66,+81]mm · 10 of 100 slices shown (1 of 4)]
[im 1/100]
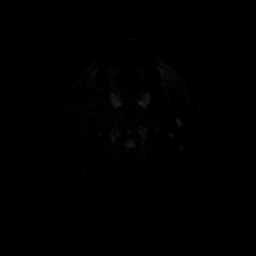
[im 12/100]
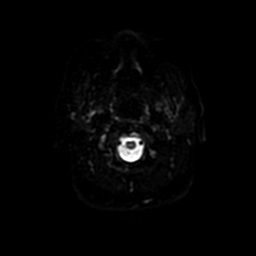
[im 23/100]
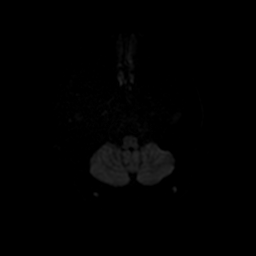
[im 34/100]
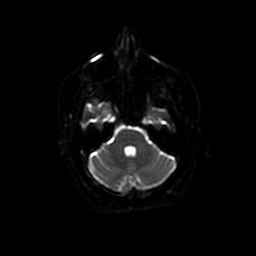
[im 45/100]
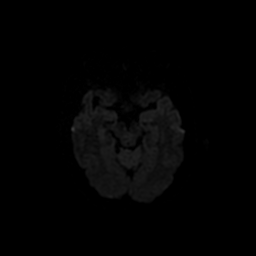
[im 56/100]
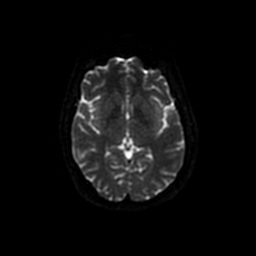
[im 67/100]
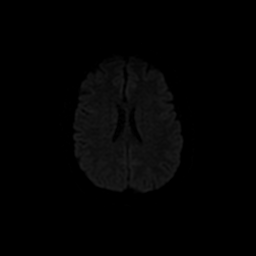
[im 78/100]
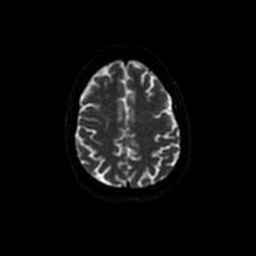
[im 89/100]
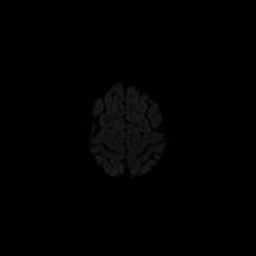
[im 100/100]
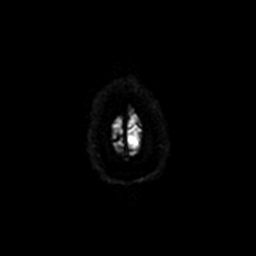

[Series 4: DWI · coronal · 5.0mm · 1.09mm/px · 6 of 72 slices shown (2 of 4)]
[im 1/72]
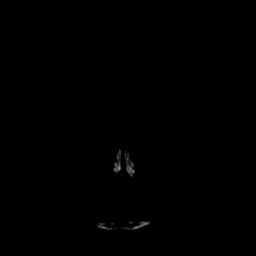
[im 15/72]
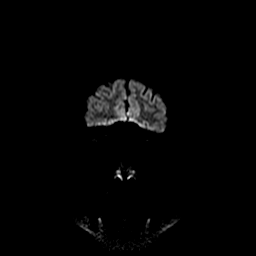
[im 29/72]
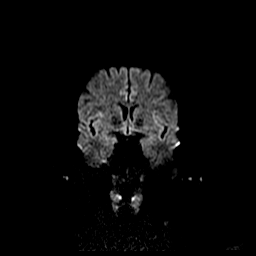
[im 43/72]
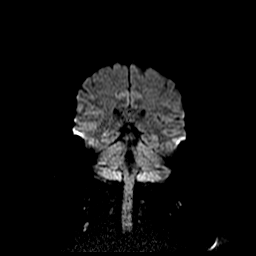
[im 57/72]
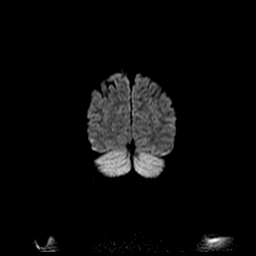
[im 72/72]
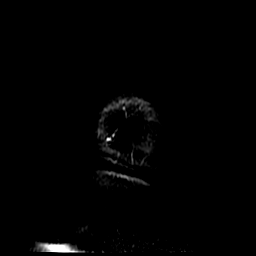

[Series 5: T1 · sagittal · 5.0mm · 0.47mm/px · 2 of 23 slices shown]
[im 1/23]
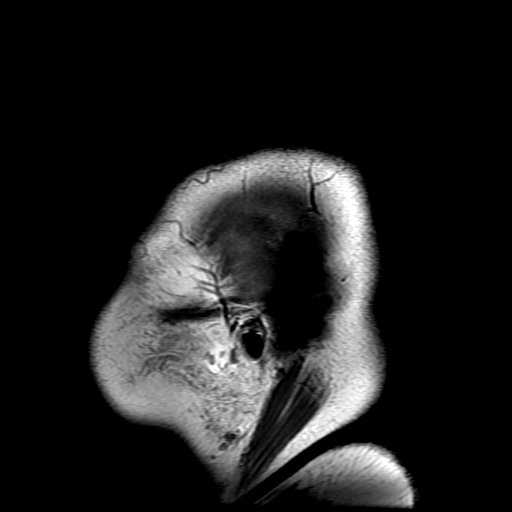
[im 23/23]
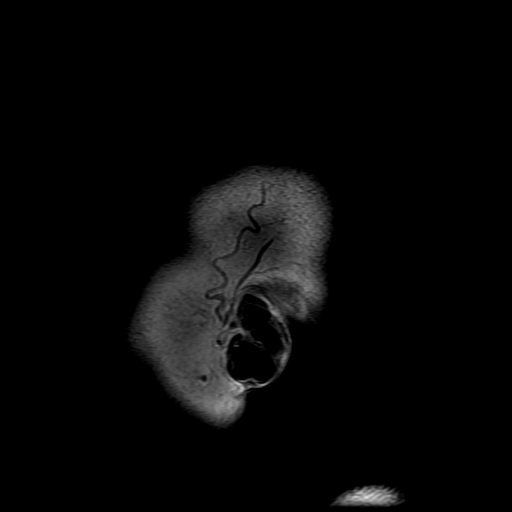

[Series 6: T2 · axial · 5.0mm · 0.43mm/px · z∈[-79,+63]mm · 2 of 25 slices shown]
[im 1/25]
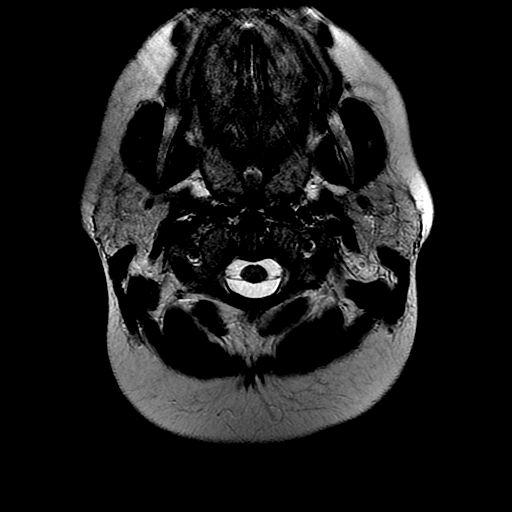
[im 25/25]
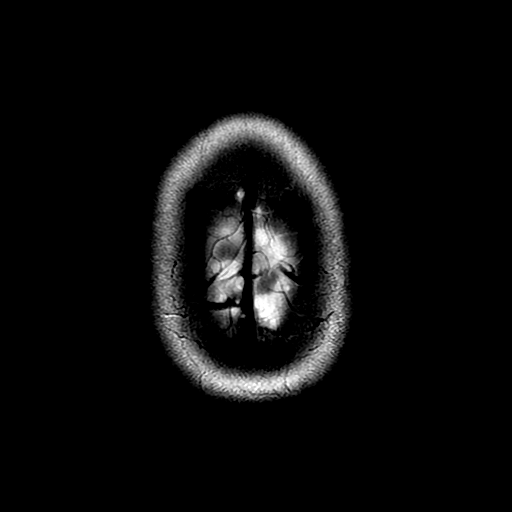

[Series 7: ax mpgr · axial · 5.0mm · 0.43mm/px · z∈[-76,+70]mm · 2 of 22 slices shown]
[im 1/22]
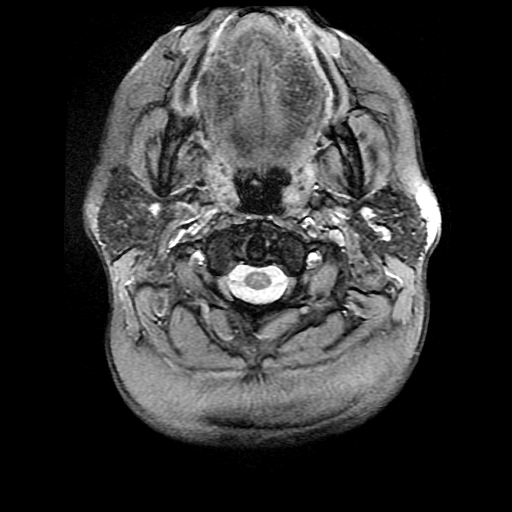
[im 22/22]
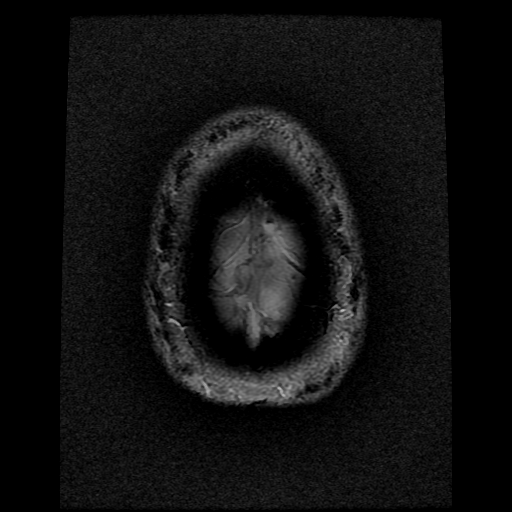

[Series 8: FLAIR · axial · 5.0mm · 0.43mm/px · z∈[-79,+63]mm · 2 of 25 slices shown]
[im 1/25]
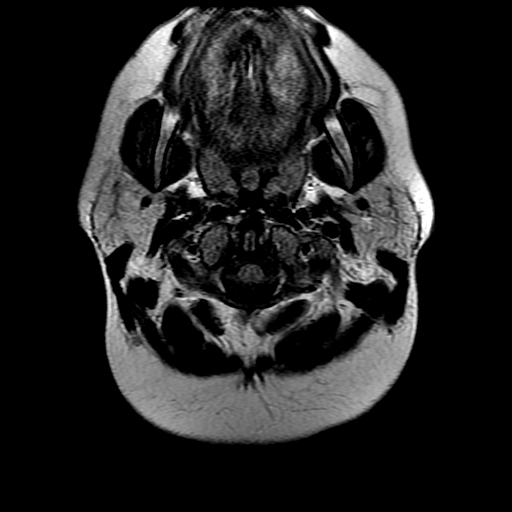
[im 25/25]
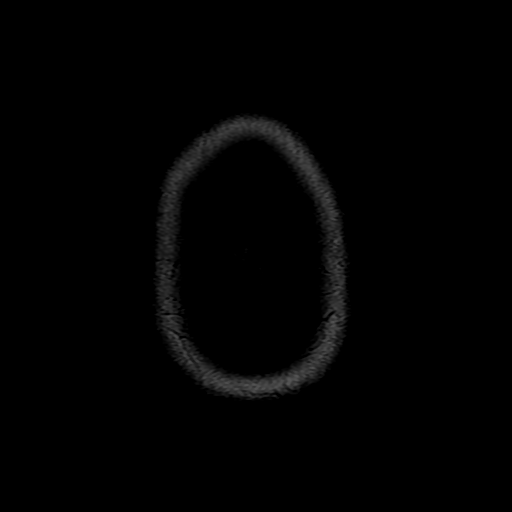

[Series 10: T2 post-contrast · coronal · 5.0mm · 0.39mm/px · 2 of 25 slices shown]
[im 1/25]
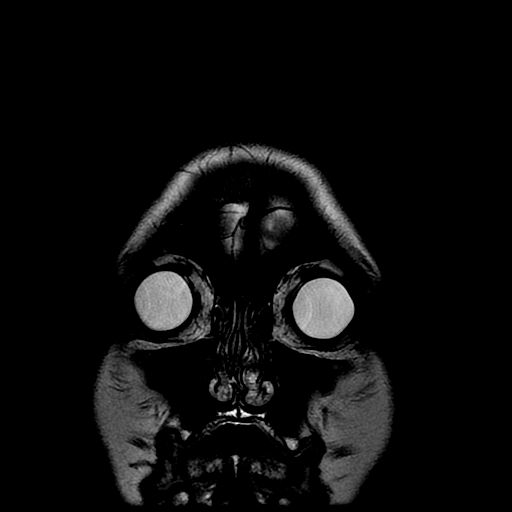
[im 25/25]
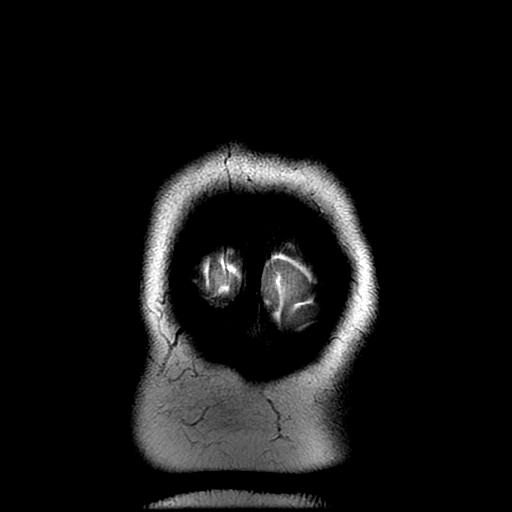

[Series 12: T1 post-contrast · coronal · 5.0mm · 0.39mm/px · 2 of 25 slices shown]
[im 1/25]
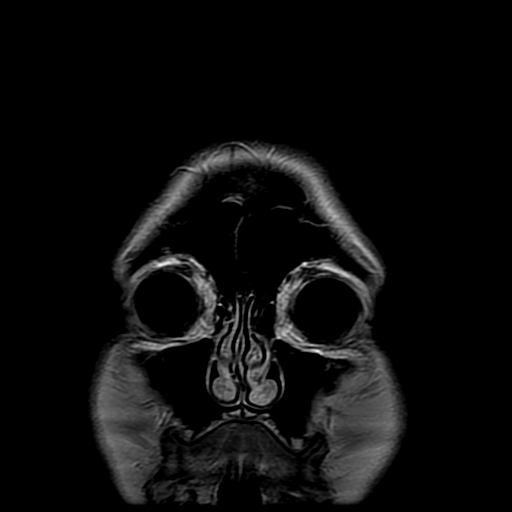
[im 25/25]
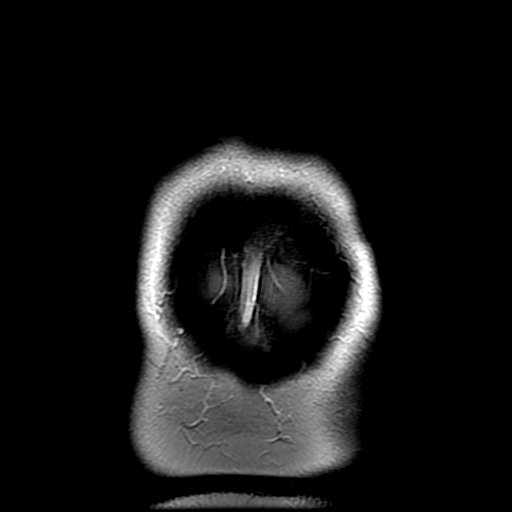

[Series 300: DWI · axial · 3.0mm · 1.09mm/px · z∈[-66,+81]mm · 4 of 50 slices shown (3 of 4)]
[im 1/50]
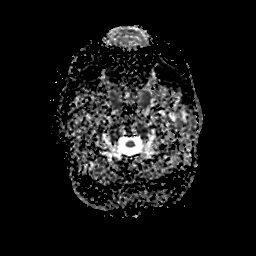
[im 17/50]
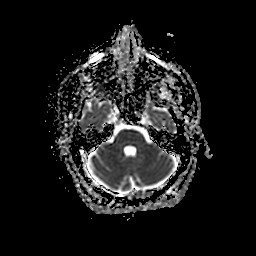
[im 33/50]
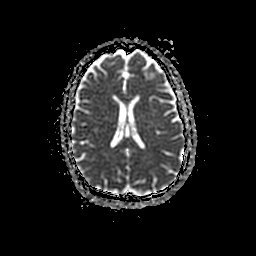
[im 50/50]
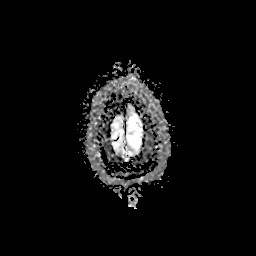

[Series 400: DWI · coronal · 5.0mm · 1.09mm/px · 3 of 36 slices shown (4 of 4)]
[im 1/36]
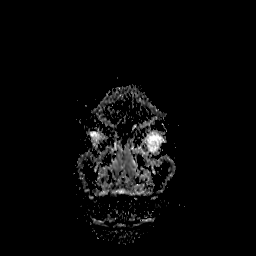
[im 18/36]
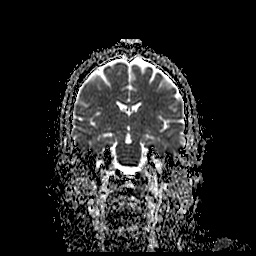
[im 36/36]
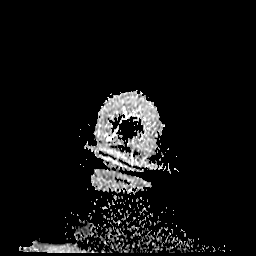

[35 of 48 positions shown; findings below may reference images not displayed]

FINDINGS: Brain: No infarction, hemorrhage, hydrocephalus, extra-axial
collection or mass lesion. Normal brain volume and white matter
appearance.

Vascular: Normal flow voids.

Skull and upper cervical spine: Normal marrow signal.

Sinuses/Orbits: Negative.
IMPRESSION: Normal brain MRI.

## 2022-12-25 ENCOUNTER — Other Ambulatory Visit (HOSPITAL_COMMUNITY): Payer: Self-pay | Admitting: *Deleted

## 2022-12-25 DIAGNOSIS — R072 Precordial pain: Secondary | ICD-10-CM

## 2022-12-26 ENCOUNTER — Other Ambulatory Visit (HOSPITAL_COMMUNITY): Payer: Self-pay | Admitting: Emergency Medicine

## 2022-12-26 ENCOUNTER — Encounter (HOSPITAL_COMMUNITY): Payer: Self-pay | Admitting: Emergency Medicine

## 2022-12-26 DIAGNOSIS — R079 Chest pain, unspecified: Secondary | ICD-10-CM

## 2022-12-26 MED ORDER — IVABRADINE HCL 5 MG PO TABS
15.0000 mg | ORAL_TABLET | Freq: Once | ORAL | 0 refills | Status: AC
Start: 2022-12-26 — End: 2022-12-26

## 2022-12-26 MED ORDER — METOPROLOL TARTRATE 100 MG PO TABS
100.0000 mg | ORAL_TABLET | Freq: Once | ORAL | 0 refills | Status: AC
Start: 2022-12-26 — End: 2022-12-26

## 2023-01-08 ENCOUNTER — Telehealth (HOSPITAL_COMMUNITY): Payer: Self-pay | Admitting: *Deleted

## 2023-01-08 NOTE — Telephone Encounter (Signed)
Reaching out to patient to offer assistance regarding upcoming cardiac imaging study; pt verbalizes understanding of appt date/time, parking situation and where to check in, pre-test NPO status and medications ordered, and verified current allergies; name and call back number provided for further questions should they arise Hayley Sharpe RN Navigator Cardiac Imaging [facility] [phone] office [phone] cell  

## 2023-01-09 ENCOUNTER — Encounter (HOSPITAL_COMMUNITY): Payer: Self-pay

## 2023-01-09 ENCOUNTER — Ambulatory Visit (HOSPITAL_COMMUNITY)
Admission: RE | Admit: 2023-01-09 | Discharge: 2023-01-09 | Disposition: A | Discharge status: Home / Self Care | Payer: 59 | Source: Ambulatory Visit | Attending: Family Medicine | Admitting: Family Medicine

## 2023-01-09 DIAGNOSIS — I517 Cardiomegaly: Secondary | ICD-10-CM | POA: Diagnosis not present

## 2023-01-09 DIAGNOSIS — R072 Precordial pain: Secondary | ICD-10-CM | POA: Insufficient documentation

## 2023-01-09 DIAGNOSIS — I2510 Atherosclerotic heart disease of native coronary artery without angina pectoris: Secondary | ICD-10-CM | POA: Diagnosis not present

## 2023-01-09 DIAGNOSIS — K760 Fatty (change of) liver, not elsewhere classified: Secondary | ICD-10-CM | POA: Diagnosis not present

## 2023-01-09 LAB — POCT I-STAT CREATININE: Creatinine, Ser: 0.8 mg/dL (ref 0.44–1.00)

## 2023-01-09 MED ORDER — NITROGLYCERIN 0.4 MG SL SUBL
0.8000 mg | SUBLINGUAL_TABLET | Freq: Once | SUBLINGUAL | Status: AC
  Administered 2023-01-09: 0.8 mg via SUBLINGUAL

## 2023-01-09 MED ORDER — NITROGLYCERIN 0.4 MG SL SUBL
SUBLINGUAL_TABLET | SUBLINGUAL | Status: AC
  Filled 2023-01-09: qty 2

## 2023-01-09 MED ORDER — IOHEXOL 350 MG/ML SOLN
95.0000 mL | Freq: Once | INTRAVENOUS | Status: AC | PRN
  Administered 2023-01-09: 95 mL via INTRAVENOUS
